# Patient Record
Sex: Female | Born: 1960 | Race: White | Hispanic: No | Marital: Married | Smoking: Never smoker
Health system: Southern US, Community
[De-identification: ages and names within clinical notes are randomized; demographics above are authoritative.]

## PROBLEM LIST (undated history)

## (undated) DIAGNOSIS — N2581 Secondary hyperparathyroidism of renal origin: Secondary | ICD-10-CM

## (undated) DIAGNOSIS — I517 Cardiomegaly: Secondary | ICD-10-CM

## (undated) DIAGNOSIS — M1990 Unspecified osteoarthritis, unspecified site: Secondary | ICD-10-CM

## (undated) DIAGNOSIS — I499 Cardiac arrhythmia, unspecified: Secondary | ICD-10-CM

## (undated) DIAGNOSIS — T8859XA Other complications of anesthesia, initial encounter: Secondary | ICD-10-CM

## (undated) DIAGNOSIS — N184 Chronic kidney disease, stage 4 (severe): Secondary | ICD-10-CM

## (undated) DIAGNOSIS — I129 Hypertensive chronic kidney disease with stage 1 through stage 4 chronic kidney disease, or unspecified chronic kidney disease: Secondary | ICD-10-CM

## (undated) DIAGNOSIS — R110 Nausea: Secondary | ICD-10-CM

## (undated) DIAGNOSIS — R0600 Dyspnea, unspecified: Secondary | ICD-10-CM

## (undated) DIAGNOSIS — Q613 Polycystic kidney, unspecified: Secondary | ICD-10-CM

## (undated) DIAGNOSIS — K219 Gastro-esophageal reflux disease without esophagitis: Secondary | ICD-10-CM

## (undated) DIAGNOSIS — Q6102 Congenital multiple renal cysts: Secondary | ICD-10-CM

## (undated) DIAGNOSIS — J189 Pneumonia, unspecified organism: Secondary | ICD-10-CM

## (undated) DIAGNOSIS — E785 Hyperlipidemia, unspecified: Secondary | ICD-10-CM

## (undated) DIAGNOSIS — N1832 Chronic kidney disease, stage 3b: Secondary | ICD-10-CM

## (undated) DIAGNOSIS — D631 Anemia in chronic kidney disease: Secondary | ICD-10-CM

## (undated) DIAGNOSIS — D329 Benign neoplasm of meninges, unspecified: Secondary | ICD-10-CM

## (undated) DIAGNOSIS — I10 Essential (primary) hypertension: Secondary | ICD-10-CM

## (undated) HISTORY — DX: Hyperlipidemia, unspecified: E78.5

## (undated) HISTORY — PX: APPENDECTOMY: SHX54

## (undated) HISTORY — DX: Chronic kidney disease, stage 3b: N18.32

## (undated) HISTORY — DX: Chronic kidney disease, stage 4 (severe): N18.4

## (undated) HISTORY — DX: Cardiomegaly: I51.7

## (undated) HISTORY — PX: WISDOM TOOTH EXTRACTION: SHX21

## (undated) HISTORY — DX: Dyspnea, unspecified: R06.00

## (undated) HISTORY — DX: Polycystic kidney, unspecified: Q61.3

## (undated) HISTORY — DX: Secondary hyperparathyroidism of renal origin: N25.81

## (undated) HISTORY — PX: CHOLECYSTECTOMY: SHX55

## (undated) HISTORY — DX: Hypertensive chronic kidney disease with stage 1 through stage 4 chronic kidney disease, or unspecified chronic kidney disease: I12.9

## (undated) HISTORY — DX: Congenital multiple renal cysts: Q61.02

## (undated) HISTORY — DX: Benign neoplasm of meninges, unspecified: D32.9

## (undated) HISTORY — PX: ABDOMINAL HYSTERECTOMY: SHX81

## (undated) HISTORY — DX: Anemia in chronic kidney disease: D63.1

## (undated) HISTORY — DX: Nausea: R11.0

---

## 1998-03-15 ENCOUNTER — Ambulatory Visit: Admission: RE | Admit: 1998-03-15 | Discharge: 1998-03-15 | Payer: Self-pay

## 2005-12-04 ENCOUNTER — Ambulatory Visit (HOSPITAL_COMMUNITY): Admission: RE | Admit: 2005-12-04 | Discharge: 2005-12-04 | Payer: Self-pay | Admitting: Nephrology

## 2005-12-04 ENCOUNTER — Encounter (INDEPENDENT_AMBULATORY_CARE_PROVIDER_SITE_OTHER): Payer: Self-pay | Admitting: Specialist

## 2006-02-24 ENCOUNTER — Encounter: Admission: RE | Admit: 2006-02-24 | Discharge: 2006-02-24 | Payer: Self-pay | Admitting: Nephrology

## 2007-04-21 ENCOUNTER — Ambulatory Visit (HOSPITAL_COMMUNITY): Admission: RE | Admit: 2007-04-21 | Discharge: 2007-04-21 | Payer: Self-pay | Admitting: Nephrology

## 2007-04-21 ENCOUNTER — Encounter (INDEPENDENT_AMBULATORY_CARE_PROVIDER_SITE_OTHER): Payer: Self-pay | Admitting: Diagnostic Radiology

## 2008-05-31 ENCOUNTER — Ambulatory Visit (HOSPITAL_COMMUNITY): Admission: RE | Admit: 2008-05-31 | Discharge: 2008-05-31 | Payer: Self-pay | Admitting: Nephrology

## 2008-05-31 ENCOUNTER — Encounter (INDEPENDENT_AMBULATORY_CARE_PROVIDER_SITE_OTHER): Payer: Self-pay | Admitting: Interventional Radiology

## 2008-11-11 ENCOUNTER — Encounter: Admission: RE | Admit: 2008-11-11 | Discharge: 2008-11-11 | Payer: Self-pay | Admitting: Nephrology

## 2008-11-11 ENCOUNTER — Ambulatory Visit (HOSPITAL_COMMUNITY): Admission: RE | Admit: 2008-11-11 | Discharge: 2008-11-11 | Payer: Self-pay | Admitting: Nephrology

## 2008-12-01 ENCOUNTER — Ambulatory Visit (HOSPITAL_COMMUNITY): Admission: RE | Admit: 2008-12-01 | Discharge: 2008-12-01 | Payer: Self-pay | Admitting: Nephrology

## 2009-05-30 ENCOUNTER — Ambulatory Visit (HOSPITAL_COMMUNITY): Admission: RE | Admit: 2009-05-30 | Discharge: 2009-05-30 | Payer: Self-pay | Admitting: Neurosurgery

## 2010-02-11 ENCOUNTER — Encounter: Payer: Self-pay | Admitting: Nephrology

## 2010-02-12 ENCOUNTER — Encounter: Payer: Self-pay | Admitting: Nephrology

## 2010-04-10 LAB — CREATININE, SERUM
Creatinine, Ser: 1.01 mg/dL (ref 0.4–1.2)
GFR calc Af Amer: >60 mL/min (ref >60)
GFR calc non Af Amer: 59 mL/min — ABNORMAL LOW (ref >60)

## 2010-04-10 LAB — BUN: BUN: 14 mg/dL (ref 6–23)

## 2010-05-01 LAB — CBC
HCT: 38.5 % (ref 36.0–46.0)
Hemoglobin: 13.7 g/dL (ref 12.0–15.0)
MCHC: 35.6 g/dL (ref 30.0–36.0)
MCV: 91.7 fL (ref 78.0–100.0)
Platelets: 125 10*3/uL — ABNORMAL LOW (ref 150–400)
RBC: 4.2 MIL/uL (ref 3.87–5.11)
RDW: 12.9 % (ref 11.5–15.5)
WBC: 5.7 10*3/uL (ref 4.0–10.5)

## 2010-05-01 LAB — BASIC METABOLIC PANEL
BUN: 16 mg/dL (ref 6–23)
CO2: 25 mEq/L (ref 19–32)
Calcium: 9.3 mg/dL (ref 8.4–10.5)
Chloride: 107 mEq/L (ref 96–112)
Creatinine, Ser: 1 mg/dL (ref 0.4–1.2)
GFR calc Af Amer: >60 mL/min (ref >60)
GFR calc non Af Amer: 60 mL/min — ABNORMAL LOW (ref >60)
Glucose, Bld: 104 mg/dL — ABNORMAL HIGH (ref 70–99)
Potassium: 4 mEq/L (ref 3.5–5.1)
Sodium: 138 mEq/L (ref 135–145)

## 2010-05-01 LAB — APTT: aPTT: 25 seconds (ref 24–37)

## 2010-05-01 LAB — PROTIME-INR
INR: 1.1 (ref 0.00–1.49)
Prothrombin Time: 14.1 seconds (ref 11.6–15.2)

## 2010-06-13 ENCOUNTER — Other Ambulatory Visit (HOSPITAL_COMMUNITY): Payer: Self-pay | Admitting: Neurosurgery

## 2010-06-13 DIAGNOSIS — R51 Headache: Secondary | ICD-10-CM

## 2010-06-13 DIAGNOSIS — D320 Benign neoplasm of cerebral meninges: Secondary | ICD-10-CM

## 2010-07-04 ENCOUNTER — Ambulatory Visit (HOSPITAL_COMMUNITY)
Admission: RE | Admit: 2010-07-04 | Discharge: 2010-07-04 | Disposition: A | Discharge status: Home / Self Care | Payer: Self-pay | Source: Ambulatory Visit | Attending: Neurosurgery | Admitting: Neurosurgery

## 2010-07-04 DIAGNOSIS — R51 Headache: Secondary | ICD-10-CM | POA: Insufficient documentation

## 2010-07-04 DIAGNOSIS — D320 Benign neoplasm of cerebral meninges: Secondary | ICD-10-CM | POA: Insufficient documentation

## 2010-07-04 LAB — CREATININE, SERUM
Creatinine, Ser: 1.1 mg/dL (ref 0.4–1.2)
GFR calc Af Amer: >60 mL/min (ref >60)
GFR calc non Af Amer: 53 mL/min — ABNORMAL LOW (ref >60)

## 2010-07-04 MED ORDER — GADOBENATE DIMEGLUMINE 529 MG/ML IV SOLN
17.0000 mL | Freq: Once | INTRAVENOUS | Status: AC | PRN
  Administered 2010-07-04: 17 mL via INTRAVENOUS

## 2010-10-15 LAB — CBC
HCT: 42.5
Hemoglobin: 15
MCHC: 35.3
MCV: 91.7
Platelets: 163
RBC: 4.63
RDW: 13.3
WBC: 7.1

## 2010-10-15 LAB — PROTIME-INR
INR: 1
Prothrombin Time: 13.2

## 2010-10-15 LAB — APTT: aPTT: 25

## 2011-02-14 ENCOUNTER — Encounter (HOSPITAL_COMMUNITY): Payer: Self-pay | Admitting: Pharmacy Technician

## 2011-02-14 ENCOUNTER — Other Ambulatory Visit: Payer: Self-pay | Admitting: Obstetrics & Gynecology

## 2011-02-14 ENCOUNTER — Other Ambulatory Visit (HOSPITAL_COMMUNITY): Payer: Self-pay | Admitting: Nephrology

## 2011-02-14 DIAGNOSIS — N281 Cyst of kidney, acquired: Secondary | ICD-10-CM

## 2011-02-15 ENCOUNTER — Other Ambulatory Visit: Payer: Self-pay | Admitting: Radiology

## 2011-02-19 ENCOUNTER — Other Ambulatory Visit (HOSPITAL_COMMUNITY): Payer: Self-pay

## 2011-02-20 ENCOUNTER — Ambulatory Visit (HOSPITAL_COMMUNITY)
Admission: RE | Admit: 2011-02-20 | Discharge: 2011-02-20 | Disposition: A | Discharge status: Home / Self Care | Payer: BC Managed Care – PPO | Source: Ambulatory Visit | Attending: Radiology | Admitting: Radiology

## 2011-02-20 ENCOUNTER — Ambulatory Visit (HOSPITAL_COMMUNITY)
Admission: RE | Admit: 2011-02-20 | Discharge: 2011-02-20 | Disposition: A | Discharge status: Home / Self Care | Payer: BC Managed Care – PPO | Source: Ambulatory Visit | Attending: Nephrology | Admitting: Nephrology

## 2011-02-20 VITALS — BP 124/65 | HR 44 | Temp 97.6°F | Resp 18 | Ht 64.0 in | Wt 162.0 lb

## 2011-02-20 DIAGNOSIS — N281 Cyst of kidney, acquired: Secondary | ICD-10-CM

## 2011-02-20 DIAGNOSIS — Q618 Other cystic kidney diseases: Secondary | ICD-10-CM | POA: Insufficient documentation

## 2011-02-20 LAB — CBC
HCT: 43.1 % (ref 36.0–46.0)
Hemoglobin: 15 g/dL (ref 12.0–15.0)
MCH: 31.6 pg (ref 26.0–34.0)
MCHC: 34.8 g/dL (ref 30.0–36.0)
MCV: 90.9 fL (ref 78.0–100.0)
Platelets: 139 10*3/uL — ABNORMAL LOW (ref 150–400)
RBC: 4.74 MIL/uL (ref 3.87–5.11)
RDW: 12.9 % (ref 11.5–15.5)
WBC: 5 10*3/uL (ref 4.0–10.5)

## 2011-02-20 LAB — PROTIME-INR
INR: 0.96 (ref 0.00–1.49)
Prothrombin Time: 13 seconds (ref 11.6–15.2)

## 2011-02-20 LAB — APTT: aPTT: 25 seconds (ref 24–37)

## 2011-02-20 MED ORDER — FENTANYL CITRATE 0.05 MG/ML IJ SOLN
INTRAMUSCULAR | Status: AC
  Filled 2011-02-20: qty 6

## 2011-02-20 MED ORDER — SODIUM CHLORIDE 0.9 % IV SOLN
INTRAVENOUS | Status: DC
  Administered 2011-02-20: 09:00:00 via INTRAVENOUS

## 2011-02-20 MED ORDER — MIDAZOLAM HCL 2 MG/2ML IJ SOLN
INTRAMUSCULAR | Status: AC
  Filled 2011-02-20: qty 6

## 2011-02-20 MED ORDER — MIDAZOLAM HCL 5 MG/5ML IJ SOLN
INTRAMUSCULAR | Status: AC | PRN
  Administered 2011-02-20 (×2): 1 mg via INTRAVENOUS
  Administered 2011-02-20: 2 mg via INTRAVENOUS

## 2011-02-20 MED ORDER — FENTANYL CITRATE 0.05 MG/ML IJ SOLN
INTRAMUSCULAR | Status: AC | PRN
  Administered 2011-02-20: 25 ug via INTRAVENOUS
  Administered 2011-02-20: 50 ug via INTRAVENOUS
  Administered 2011-02-20: 25 ug via INTRAVENOUS
  Administered 2011-02-20 (×2): 50 ug via INTRAVENOUS

## 2011-02-20 NOTE — H&P (Signed)
Debbie Ward is an 51 y.o. female.   Chief Complaint: symptomatic bilateral renal cysts HPI: Patient with history of symptomatic  polycystic kidney disease presents today for elective US guided aspiration of bilateral renal cysts.  PMH: migraines, menigioma, PCKD Psh: partial hyst., appy.,cholecystectomy No family history on file. Social History: married, lives in [place], prior smoker, no alcohol, 4 children  Allergies: No Known Allergies  Medications Prior to Admission  Medication Sig Dispense Refill  . lisinopril-hydrochlorothiazide (PRINZIDE,ZESTORETIC) 10-12.5 MG per tablet Take 1 tablet by mouth daily.       Medications Prior to Admission  Medication Dose Route Frequency Provider Last Rate Last Dose  . 0.9 %  sodium chloride infusion   Intravenous Continuous Robet Leu, PA 20 mL/hr at 02/20/11 1610      Results for orders placed during the hospital encounter of 02/20/11 (from the past 48 hour(s))  APTT     Status: Normal   Collection Time   02/20/11  8:58 AM      Component Value Range Comment   aPTT 25  24 - 37 (seconds)   CBC     Status: Abnormal   Collection Time   02/20/11  8:58 AM      Component Value Range Comment   WBC 5.0  4.0 - 10.5 (K/uL)    RBC 4.74  3.87 - 5.11 (MIL/uL)    Hemoglobin 15.0  12.0 - 15.0 (g/dL)    HCT 96.0  45.4 - 09.8 (%)    MCV 90.9  78.0 - 100.0 (fL)    MCH 31.6  26.0 - 34.0 (pg)    MCHC 34.8  30.0 - 36.0 (g/dL)    RDW 11.9  14.7 - 82.9 (%)    Platelets 139 (*) 150 - 400 (K/uL)   PROTIME-INR     Status: Normal   Collection Time   02/20/11  8:58 AM      Component Value Range Comment   Prothrombin Time 13.0  11.6 - 15.2 (seconds)    INR 0.96  0.00 - 1.49     No results found.  Review of Systems  Constitutional: Negative for fever and chills.  Respiratory: Negative for cough and shortness of breath.   Cardiovascular: Negative for chest pain.  Gastrointestinal: Negative for nausea, vomiting and abdominal pain.  Genitourinary:  Positive for flank pain. Negative for dysuria and hematuria.  Musculoskeletal: Positive for back pain.  Neurological: Positive for headaches.  Endo/Heme/Allergies: Does not bruise/bleed easily.    Blood pressure 124/65, pulse 44, temperature 97.6 F (36.4 C), temperature source Oral, resp. rate 18, height 5\' 4"  (1.626 m), weight 162 lb (73.483 kg), SpO2 95.00%. Physical Exam  Constitutional: She is oriented to person, place, and time. She appears well-developed and well-nourished.  Cardiovascular: Regular rhythm.        bradycardic  Respiratory: Effort normal and breath sounds normal.  GI: Soft. Bowel sounds are normal. There is no tenderness.  Musculoskeletal: Normal range of motion. She exhibits no edema.  Neurological: She is alert and oriented to person, place, and time.     Assessment/Plan: Patient with symptomatic polycystic kidney disease; plan is for follow up renal US followed by  aspiration of bilateral renal cysts. Jennalynn Rivard,D KEVIN 02/20/2011, 9:53 AM

## 2011-02-20 NOTE — ED Notes (Signed)
IV - 22 g to left hand.  Dressing clean dry and intact, flushes with ease and without complaint of discomfort.  Start NS at [facility] for procedure.

## 2011-02-21 ENCOUNTER — Telehealth (HOSPITAL_COMMUNITY): Payer: Self-pay

## 2011-12-30 ENCOUNTER — Other Ambulatory Visit (HOSPITAL_COMMUNITY): Payer: Self-pay | Admitting: Neurosurgery

## 2011-12-30 DIAGNOSIS — D320 Benign neoplasm of cerebral meninges: Secondary | ICD-10-CM

## 2012-01-06 ENCOUNTER — Ambulatory Visit (HOSPITAL_COMMUNITY): Payer: BC Managed Care – PPO

## 2012-01-08 ENCOUNTER — Ambulatory Visit (HOSPITAL_COMMUNITY): Admission: RE | Admit: 2012-01-08 | Payer: BC Managed Care – PPO | Source: Ambulatory Visit

## 2012-01-08 ENCOUNTER — Ambulatory Visit (HOSPITAL_COMMUNITY)
Admission: RE | Admit: 2012-01-08 | Discharge: 2012-01-08 | Disposition: A | Discharge status: Home / Self Care | Payer: BC Managed Care – PPO | Source: Ambulatory Visit | Attending: Neurosurgery | Admitting: Neurosurgery

## 2012-01-08 DIAGNOSIS — Z09 Encounter for follow-up examination after completed treatment for conditions other than malignant neoplasm: Secondary | ICD-10-CM | POA: Insufficient documentation

## 2012-01-08 DIAGNOSIS — D320 Benign neoplasm of cerebral meninges: Secondary | ICD-10-CM | POA: Insufficient documentation

## 2012-01-08 LAB — CREATININE, SERUM
Creatinine, Ser: 1.19 mg/dL — ABNORMAL HIGH (ref 0.50–1.10)
GFR calc Af Amer: 61 mL/min — ABNORMAL LOW (ref >90)
GFR calc non Af Amer: 53 mL/min — ABNORMAL LOW (ref >90)

## 2012-01-08 MED ORDER — GADOBENATE DIMEGLUMINE 529 MG/ML IV SOLN
17.0000 mL | Freq: Once | INTRAVENOUS | Status: AC | PRN
  Administered 2012-01-08: 17 mL via INTRAVENOUS

## 2012-05-12 ENCOUNTER — Ambulatory Visit: Payer: Self-pay | Admitting: Neurology

## 2012-07-28 ENCOUNTER — Other Ambulatory Visit (HOSPITAL_COMMUNITY): Payer: Self-pay | Admitting: Nephrology

## 2012-07-28 DIAGNOSIS — M549 Dorsalgia, unspecified: Secondary | ICD-10-CM

## 2012-07-28 DIAGNOSIS — N281 Cyst of kidney, acquired: Secondary | ICD-10-CM

## 2012-07-28 DIAGNOSIS — R809 Proteinuria, unspecified: Secondary | ICD-10-CM

## 2012-07-30 ENCOUNTER — Other Ambulatory Visit: Payer: Self-pay | Admitting: Radiology

## 2012-07-30 ENCOUNTER — Encounter (HOSPITAL_COMMUNITY): Payer: Self-pay | Admitting: Pharmacy Technician

## 2012-08-03 ENCOUNTER — Ambulatory Visit (HOSPITAL_COMMUNITY)
Admission: RE | Admit: 2012-08-03 | Discharge: 2012-08-03 | Disposition: A | Discharge status: Home / Self Care | Payer: Self-pay | Source: Ambulatory Visit | Attending: Nephrology | Admitting: Nephrology

## 2012-08-03 ENCOUNTER — Other Ambulatory Visit (HOSPITAL_COMMUNITY): Payer: Self-pay | Admitting: Nephrology

## 2012-08-03 ENCOUNTER — Encounter (HOSPITAL_COMMUNITY): Payer: Self-pay

## 2012-08-03 VITALS — BP 112/71 | HR 56 | Temp 97.6°F | Resp 14 | Ht 64.0 in | Wt 172.0 lb

## 2012-08-03 DIAGNOSIS — Q612 Polycystic kidney, adult type: Secondary | ICD-10-CM | POA: Insufficient documentation

## 2012-08-03 DIAGNOSIS — M549 Dorsalgia, unspecified: Secondary | ICD-10-CM

## 2012-08-03 DIAGNOSIS — R809 Proteinuria, unspecified: Secondary | ICD-10-CM

## 2012-08-03 DIAGNOSIS — Q613 Polycystic kidney, unspecified: Secondary | ICD-10-CM | POA: Insufficient documentation

## 2012-08-03 DIAGNOSIS — N281 Cyst of kidney, acquired: Secondary | ICD-10-CM

## 2012-08-03 HISTORY — DX: Polycystic kidney, unspecified: Q61.3

## 2012-08-03 LAB — CBC
HCT: 41.6 % (ref 36.0–46.0)
Hemoglobin: 14.7 g/dL (ref 12.0–15.0)
MCH: 31.4 pg (ref 26.0–34.0)
MCHC: 35.3 g/dL (ref 30.0–36.0)
MCV: 88.9 fL (ref 78.0–100.0)
Platelets: 144 10*3/uL — ABNORMAL LOW (ref 150–400)
RBC: 4.68 MIL/uL (ref 3.87–5.11)
RDW: 13.4 % (ref 11.5–15.5)
WBC: 5.5 10*3/uL (ref 4.0–10.5)

## 2012-08-03 LAB — PROTIME-INR
INR: 0.99 (ref 0.00–1.49)
Prothrombin Time: 12.9 seconds (ref 11.6–15.2)

## 2012-08-03 LAB — APTT: aPTT: 23 seconds — ABNORMAL LOW (ref 24–37)

## 2012-08-03 MED ORDER — HYDROCODONE-ACETAMINOPHEN 5-325 MG PO TABS
ORAL_TABLET | ORAL | Status: AC
  Filled 2012-08-03: qty 2

## 2012-08-03 MED ORDER — SODIUM CHLORIDE 0.9 % IV SOLN
Freq: Once | INTRAVENOUS | Status: AC
  Administered 2012-08-03: 14:00:00 via INTRAVENOUS

## 2012-08-03 MED ORDER — FENTANYL CITRATE 0.05 MG/ML IJ SOLN
INTRAMUSCULAR | Status: AC
  Filled 2012-08-03: qty 4

## 2012-08-03 MED ORDER — FENTANYL CITRATE 0.05 MG/ML IJ SOLN
INTRAMUSCULAR | Status: AC | PRN
  Administered 2012-08-03 (×2): 12.5 ug via INTRAVENOUS
  Administered 2012-08-03 (×7): 25 ug via INTRAVENOUS

## 2012-08-03 MED ORDER — MIDAZOLAM HCL 2 MG/2ML IJ SOLN
INTRAMUSCULAR | Status: AC | PRN
  Administered 2012-08-03: 0.5 mg via INTRAVENOUS
  Administered 2012-08-03 (×2): 1 mg via INTRAVENOUS
  Administered 2012-08-03 (×3): 0.5 mg via INTRAVENOUS

## 2012-08-03 MED ORDER — HYDROCODONE-ACETAMINOPHEN 5-325 MG PO TABS
1.0000 | ORAL_TABLET | ORAL | Status: DC | PRN
  Administered 2012-08-03: 2 via ORAL

## 2012-08-03 MED ORDER — MIDAZOLAM HCL 2 MG/2ML IJ SOLN
INTRAMUSCULAR | Status: AC
  Filled 2012-08-03: qty 4

## 2012-08-03 NOTE — Procedures (Signed)
Interventional Radiology Procedure Note  Procedure: US guided aspiration of multiple bilateral renal cysts Complications: None Recommendations: - Bedrest x 2 hrs - Return to IR if/when symptoms return  Signed,  Sterling Big, MD Vascular & Interventional Radiologist [facility]

## 2012-08-03 NOTE — H&P (Signed)
Agree with PA note.    Signed,  Karmello Abercrombie K. Sokha Craker, MD Vascular & Interventional Radiologist [facility]  

## 2012-08-03 NOTE — H&P (Signed)
Chief Complaint: "I have polycystic kidney disease and here for aspiration today." Referring Physician: Dr. Hyman Hopes HPI: Debbie Ward is an 52 y.o. female with PMHx PCKD. She states she has multiple renal cyst aspirations. Last renal cyst aspiration was 01/2011. Patient c/o B/L flank pain left > right. She denies any hematuria, denies any recent illness, fever or chills.   Past Medical History:  Past Medical History  Diagnosis Date  . Polycystic renal disease     Past Surgical History:  Past Surgical History  Procedure Laterality Date  . Appendectomy    . Abdominal hysterectomy      partial  . Cholecystectomy      Family History: History reviewed. No pertinent family history.  Social History:  reports that she has never smoked. She does not have any smokeless tobacco history on file. Her alcohol and drug histories are not on file.  Allergies: No Known Allergies    Medication List    ASK your doctor about these medications       ibuprofen 200 MG tablet  Commonly known as:  ADVIL,MOTRIN  Take 400 mg by mouth daily as needed for pain.     lisinopril-hydrochlorothiazide 10-12.5 MG per tablet  Commonly known as:  PRINZIDE,ZESTORETIC  Take 1 tablet by mouth daily.       Please HPI for pertinent positives, otherwise complete 10 system ROS negative.  Physical Exam: BP 123/62  Pulse 52  Temp(Src) 98.6 F (37 C) (Oral)  Resp 18  Ht 5\' 4"  (1.626 m)  Wt 172 lb (78.019 kg)  BMI 29.51 kg/m2  SpO2 99% Body mass index is 29.51 kg/(m^2).   General Appearance:  Alert, cooperative, no distress, appears stated age  Head:  Normocephalic, without obvious abnormality, atraumatic  ENT: Unremarkable  Neck: Supple, symmetrical, trachea midline  Lungs:   Clear to auscultation bilaterally, no w/r/r, respirations unlabored without use of accessory muscles.  Chest Wall:  No tenderness or deformity  Heart:  Regular rate and rhythm, S1, S2 normal, no murmur, rub or gallop.  Abdomen:    Soft, non-tender, non distended.  Extremities: Extremities normal, atraumatic, no cyanosis or edema  Neurologic: Normal affect, no gross deficits.   No results found for this or any previous visit (from the past 48 hour(s)). No results found.  Assessment/Plan Symptomatic polycystic kidney disease with B/L flank pain. History of B/L renal cyst aspirations with relief of pain. Patient c/o B/L flank pain left > right. Patient is scheduled for ultrasound guided renal cyst aspiration. Risks and Benefits discussed with the patient. All of the patient's questions were answered, patient is agreeable to proceed. Consent signed and in chart.   Pattricia Boss D PA-C 08/03/2012, 1:19 PM

## 2013-09-29 ENCOUNTER — Other Ambulatory Visit (HOSPITAL_COMMUNITY): Payer: Self-pay | Admitting: Nephrology

## 2013-09-29 DIAGNOSIS — Q6102 Congenital multiple renal cysts: Secondary | ICD-10-CM

## 2013-10-07 ENCOUNTER — Other Ambulatory Visit: Payer: Self-pay | Admitting: Radiology

## 2013-10-08 ENCOUNTER — Encounter (HOSPITAL_COMMUNITY): Payer: Self-pay | Admitting: Pharmacy Technician

## 2013-10-11 ENCOUNTER — Ambulatory Visit (HOSPITAL_COMMUNITY)
Admission: RE | Admit: 2013-10-11 | Discharge: 2013-10-11 | Disposition: A | Discharge status: Home / Self Care | Payer: BC Managed Care – PPO | Source: Ambulatory Visit | Attending: Nephrology | Admitting: Nephrology

## 2013-10-11 ENCOUNTER — Other Ambulatory Visit (HOSPITAL_COMMUNITY): Payer: Self-pay | Admitting: Nephrology

## 2013-10-11 DIAGNOSIS — Q618 Other cystic kidney diseases: Secondary | ICD-10-CM | POA: Diagnosis not present

## 2013-10-11 DIAGNOSIS — Q6102 Congenital multiple renal cysts: Secondary | ICD-10-CM

## 2013-10-11 LAB — CBC
HCT: 42 % (ref 36.0–46.0)
Hemoglobin: 15 g/dL (ref 12.0–15.0)
MCH: 31.6 pg (ref 26.0–34.0)
MCHC: 35.7 g/dL (ref 30.0–36.0)
MCV: 88.4 fL (ref 78.0–100.0)
Platelets: 142 10*3/uL — ABNORMAL LOW (ref 150–400)
RBC: 4.75 MIL/uL (ref 3.87–5.11)
RDW: 12.7 % (ref 11.5–15.5)
WBC: 5 10*3/uL (ref 4.0–10.5)

## 2013-10-11 LAB — PROTIME-INR
INR: 0.99 (ref 0.00–1.49)
Prothrombin Time: 13.1 seconds (ref 11.6–15.2)

## 2013-10-11 LAB — APTT: aPTT: 24 seconds (ref 24–37)

## 2013-10-11 MED ORDER — SODIUM CHLORIDE 0.9 % IV SOLN
INTRAVENOUS | Status: DC
  Administered 2013-10-11: 09:00:00 via INTRAVENOUS

## 2013-10-11 MED ORDER — FENTANYL CITRATE 0.05 MG/ML IJ SOLN
INTRAMUSCULAR | Status: AC | PRN
  Administered 2013-10-11: 50 ug via INTRAVENOUS

## 2013-10-11 MED ORDER — MIDAZOLAM HCL 2 MG/2ML IJ SOLN
INTRAMUSCULAR | Status: AC | PRN
  Administered 2013-10-11: 0.5 mg via INTRAVENOUS

## 2013-10-11 MED ORDER — MIDAZOLAM HCL 2 MG/2ML IJ SOLN
INTRAMUSCULAR | Status: AC | PRN
  Administered 2013-10-11: 1 mg via INTRAVENOUS

## 2013-10-11 MED ORDER — LIDOCAINE HCL (PF) 1 % IJ SOLN
INTRAMUSCULAR | Status: AC
  Filled 2013-10-11: qty 20

## 2013-10-11 MED ORDER — HYDROCODONE-ACETAMINOPHEN 5-325 MG PO TABS
ORAL_TABLET | ORAL | Status: AC
  Filled 2013-10-11: qty 1

## 2013-10-11 MED ORDER — MIDAZOLAM HCL 2 MG/2ML IJ SOLN
INTRAMUSCULAR | Status: AC
  Filled 2013-10-11: qty 4

## 2013-10-11 MED ORDER — MIDAZOLAM HCL 2 MG/2ML IJ SOLN
INTRAMUSCULAR | Status: AC | PRN
  Administered 2013-10-11: 2 mg via INTRAVENOUS

## 2013-10-11 MED ORDER — HYDROCODONE-ACETAMINOPHEN 5-325 MG PO TABS
1.0000 | ORAL_TABLET | Freq: Once | ORAL | Status: AC
  Administered 2013-10-11: 1 via ORAL
  Filled 2013-10-11: qty 2

## 2013-10-11 MED ORDER — FENTANYL CITRATE 0.05 MG/ML IJ SOLN
INTRAMUSCULAR | Status: AC
  Filled 2013-10-11: qty 4

## 2013-10-11 NOTE — H&P (Signed)
Chief Complaint: Polycystic kidney disease New onset back pain x few weeks  Referring Physician(s): Webb,Martin W  History of Present Illness: Debbie Ward is a 53 y.o. female  Pt with Polycystic kidney disease Has had previous aspirations of cysts in 07/2012 and 01/2011 Good relief Has had new onset back pain with most movements x few weeks Scheduled now for aspiration of Bilateral renal cysts   Past Medical History  Diagnosis Date  . Polycystic renal disease     Past Surgical History  Procedure Laterality Date  . Appendectomy    . Abdominal hysterectomy      partial  . Cholecystectomy      Allergies: Percocet  Medications: Prior to Admission medications   Medication Sig Start Date End Date Taking? Authorizing Provider  acetaminophen (TYLENOL) 650 MG CR tablet Take 1,300 mg by mouth daily as needed for pain.   Yes Historical Provider, MD  ibuprofen (ADVIL,MOTRIN) 200 MG tablet Take 400 mg by mouth daily as needed for pain.    Yes Historical Provider, MD  lisinopril-hydrochlorothiazide (PRINZIDE,ZESTORETIC) 10-12.5 MG per tablet Take 1 tablet by mouth daily.   Yes Historical Provider, MD  phentermine (ADIPEX-P) 37.5 MG tablet Take 37.5 mg by mouth daily before breakfast.   Yes Historical Provider, MD    No family history on file.  History   Social History  . Marital Status: Married    Spouse Name: N/A    Number of Children: N/A  . Years of Education: N/A   Social History Main Topics  . Smoking status: Never Smoker   . Smokeless tobacco: Not on file  . Alcohol Use: Not on file  . Drug Use: Not on file  . Sexual Activity: Not on file   Other Topics Concern  . Not on file   Social History Narrative  . No narrative on file     Review of Systems: A 12 point ROS discussed and pertinent positives are indicated in the HPI above.  All other systems are negative.  Review of Systems  Constitutional: Positive for activity change. Negative for appetite  change and unexpected weight change.  Respiratory: Negative for chest tightness and shortness of breath.   Cardiovascular: Negative for chest pain.  Gastrointestinal: Positive for abdominal pain.  Genitourinary: Negative for dysuria and frequency.  Musculoskeletal: Positive for back pain.  Psychiatric/Behavioral: Negative for confusion.    Vital Signs: BP 131/83  Pulse 58  Temp(Src) 98 F (36.7 C) (Oral)  Resp 18  Ht 5\' 4"  (1.626 m)  Wt 83.915 kg (185 lb)  BMI 31.74 kg/m2  SpO2 98%  Physical Exam  Constitutional: She is oriented to person, place, and time. She appears well-nourished.  Cardiovascular: Normal rate and regular rhythm.   No murmur heard. Pulmonary/Chest: Effort normal and breath sounds normal. She has no wheezes.  Abdominal: Soft. Bowel sounds are normal. There is no tenderness.  Musculoskeletal: Normal range of motion.  Neurological: She is alert and oriented to person, place, and time.  Skin: Skin is warm and dry.  Psychiatric: She has a normal mood and affect. Her behavior is normal. Judgment and thought content normal.    Imaging: No results found.  Labs: Lab Results  Component Value Date   WBC 5.0 10/11/2013   HCT 42.0 10/11/2013   MCV 88.4 10/11/2013   PLT 142* 10/11/2013   NA 138 05/31/2008   K 4.0 05/31/2008   CL 107 05/31/2008   CO2 25 05/31/2008   GLUCOSE 104* 05/31/2008  BUN 14 05/30/2009   CREATININE 1.19* 01/08/2012   CALCIUM 9.3 05/31/2008   GFRNONAA 53* 01/08/2012   GFRAA 61* 01/08/2012   INR 0.99 08/03/2012    Assessment and Plan:  Known PCK disease Has had renal cyst aspirations previously with great relief New back pain for few weeks Now scheduled for [place] and probable aspiration B cysts Pt aware of procedure benefits and risks and agreeable to proceed Consent signed and  In chart  Thank you for this interesting consult.  I greatly enjoyed meeting Debbie Ward and look forward to participating in their care.  Lavonia Drafts  PAC-IR   I spent a total of 20 minutes face to face in clinical consultation, greater than 50% of which was counseling/coordinating care for B renal cysts aspiration

## 2013-10-11 NOTE — Discharge Instructions (Signed)
Fine Needle Aspiration °Fine needle aspiration is a procedure used to remove a piece of tissue. The tissue may be removed from a swelling or abnormal growth (tumor). It is also used to confirm a cyst. A cyst is a fluid filled sac. The procedure may be done by your caregiver or a specialist. °LET YOUR CAREGIVER KNOW ABOUT:  °· Any medications you take, especially blood thinners like aspirin. °· Any problems you may have had with similar procedures in the past. °RISKS AND COMPLICATIONS °This is a safe procedure. There is a very small risk of infection and or bleeding. Other complications can occur if the aspiration site is deep in the body. Your caregiver or specialist will explain this to you.  °BEFORE THE PROCEDURE  °· No special preparation is needed in most cases. Your caregiver will let you know if there are special requirements, such as an empty stomach. °· Be sure to ask your caregiver any questions before the procedure starts. °PROCEDURE  °· This procedure is done under local or no anesthetic. °· The skin is cleaned carefully. °· A thin needle is directed into a lump. The needle is directed in several different directions into the lump while suction is applied to the needle. When samples are removed, the needle is withdrawn. °· The contents obtained are placed on a slide. They are then fixed, stained and examined under the microscope. The slide is examined by a specialist in the examination of tissue (pathologist). °· A diagnosis can then be made. The pathologist will decide if the specimen is cancerous (malignant) or not cancerous (benign). If fluid is taken from a cyst, cells from the fluid can be examined. If no material is obtained from a fine needle aspiration, the sample may not rule out a problem. Sometimes the procedure is done again. The pathologist may need several days before a result is available. °AFTER THE PROCEDURE  °· There are usually no limits on diet or activity after a fine needle  aspiration. °· Your caregiver may give you instructions regarding the aspiration site. This will include information about keeping the site clean and dry. Follow these instructions carefully. °· Call for your test results as instructed by your caregiver. Remember, it is your responsibility to get the results of your testing. Do not think everything is fine if you have not heard from your caregiver. °· Keep a close watch on the aspiration site. Report any redness, swelling or drainage. °· You should not have much pain at the aspiration site. °· Take any medications as told by your caregiver. °SEEK MEDICAL CARE IF:  °· You have pain or drainage at the aspiration site that does not go away. °· Swelling at the aspiration site does not gradually go away. °SEEK IMMEDIATE MEDICAL CARE IF:  °· You develop a fever a day or two after the aspiration. °· You develop severe pain at the aspiration site. °· You develop a warm, tender swelling at the aspiration site. °Document Released: 01/05/2000 Document Revised: 04/01/2011 Document Reviewed: 09/18/2007 °ExitCare® Patient Information ©2015 [employer]. This information is not intended to replace advice given to you by your health care provider. Make sure you discuss any questions you have with your health care provider. ° °

## 2013-10-11 NOTE — Procedures (Signed)
Interventional Radiology Procedure Note  Procedure: US guided aspiration of multiple bilateral renal cysts.  A total of 1000 mL cyst fluid was aspirated.  Complications: None Recommendations: - Bedrest x 3 hrs - ADAT  Signed,  Criselda Peaches, MD

## 2013-12-01 DIAGNOSIS — R404 Transient alteration of awareness: Secondary | ICD-10-CM

## 2013-12-01 DIAGNOSIS — F32A Depression, unspecified: Secondary | ICD-10-CM

## 2013-12-01 HISTORY — DX: Depression, unspecified: F32.A

## 2013-12-01 HISTORY — DX: Transient alteration of awareness: R40.4

## 2014-04-20 DIAGNOSIS — R569 Unspecified convulsions: Secondary | ICD-10-CM | POA: Insufficient documentation

## 2014-04-20 HISTORY — DX: Unspecified convulsions: R56.9

## 2014-10-14 ENCOUNTER — Other Ambulatory Visit (HOSPITAL_COMMUNITY): Payer: Self-pay | Admitting: Nephrology

## 2014-10-14 DIAGNOSIS — Q6102 Congenital multiple renal cysts: Secondary | ICD-10-CM

## 2014-10-19 ENCOUNTER — Other Ambulatory Visit: Payer: Self-pay | Admitting: Radiology

## 2014-10-20 ENCOUNTER — Ambulatory Visit (HOSPITAL_COMMUNITY)
Admission: RE | Admit: 2014-10-20 | Discharge: 2014-10-20 | Disposition: A | Discharge status: Home / Self Care | Payer: BLUE CROSS/BLUE SHIELD | Source: Ambulatory Visit | Attending: Physician Assistant | Admitting: Physician Assistant

## 2014-10-20 ENCOUNTER — Encounter (HOSPITAL_COMMUNITY): Payer: Self-pay

## 2014-10-20 ENCOUNTER — Ambulatory Visit (HOSPITAL_COMMUNITY)
Admission: RE | Admit: 2014-10-20 | Discharge: 2014-10-20 | Disposition: A | Discharge status: Home / Self Care | Payer: BLUE CROSS/BLUE SHIELD | Source: Ambulatory Visit | Attending: Nephrology | Admitting: Nephrology

## 2014-10-20 DIAGNOSIS — M545 Low back pain: Secondary | ICD-10-CM | POA: Diagnosis not present

## 2014-10-20 DIAGNOSIS — Q612 Polycystic kidney, adult type: Secondary | ICD-10-CM | POA: Diagnosis not present

## 2014-10-20 DIAGNOSIS — Q6102 Congenital multiple renal cysts: Secondary | ICD-10-CM | POA: Insufficient documentation

## 2014-10-20 DIAGNOSIS — R109 Unspecified abdominal pain: Secondary | ICD-10-CM | POA: Insufficient documentation

## 2014-10-20 DIAGNOSIS — R934 Abnormal findings on diagnostic imaging of urinary organs: Secondary | ICD-10-CM | POA: Diagnosis not present

## 2014-10-20 DIAGNOSIS — R319 Hematuria, unspecified: Secondary | ICD-10-CM

## 2014-10-20 DIAGNOSIS — Z79899 Other long term (current) drug therapy: Secondary | ICD-10-CM | POA: Insufficient documentation

## 2014-10-20 DIAGNOSIS — G8929 Other chronic pain: Secondary | ICD-10-CM | POA: Diagnosis not present

## 2014-10-20 DIAGNOSIS — Q613 Polycystic kidney, unspecified: Secondary | ICD-10-CM | POA: Diagnosis present

## 2014-10-20 DIAGNOSIS — Q446 Cystic disease of liver: Secondary | ICD-10-CM | POA: Diagnosis not present

## 2014-10-20 LAB — PROTIME-INR
INR: 0.98 (ref 0.00–1.49)
Prothrombin Time: 13.2 seconds (ref 11.6–15.2)

## 2014-10-20 LAB — CBC
HCT: 44.2 % (ref 36.0–46.0)
Hemoglobin: 15.2 g/dL — ABNORMAL HIGH (ref 12.0–15.0)
MCH: 30.9 pg (ref 26.0–34.0)
MCHC: 34.4 g/dL (ref 30.0–36.0)
MCV: 89.8 fL (ref 78.0–100.0)
Platelets: 153 10*3/uL (ref 150–400)
RBC: 4.92 MIL/uL (ref 3.87–5.11)
RDW: 12.7 % (ref 11.5–15.5)
WBC: 5.2 10*3/uL (ref 4.0–10.5)

## 2014-10-20 LAB — APTT: aPTT: 25 seconds (ref 24–37)

## 2014-10-20 MED ORDER — FENTANYL CITRATE (PF) 100 MCG/2ML IJ SOLN
INTRAMUSCULAR | Status: AC
  Filled 2014-10-20: qty 2

## 2014-10-20 MED ORDER — LIDOCAINE HCL (PF) 1 % IJ SOLN
INTRAMUSCULAR | Status: AC
  Filled 2014-10-20: qty 20

## 2014-10-20 MED ORDER — MIDAZOLAM HCL 2 MG/2ML IJ SOLN
INTRAMUSCULAR | Status: AC
  Filled 2014-10-20: qty 2

## 2014-10-20 MED ORDER — SODIUM CHLORIDE 0.9 % IV SOLN: Freq: Once | INTRAVENOUS | Status: DC

## 2014-10-20 MED ORDER — MIDAZOLAM HCL 2 MG/2ML IJ SOLN
INTRAMUSCULAR | Status: AC | PRN
  Administered 2014-10-20: 1 mg via INTRAVENOUS
  Administered 2014-10-20: 0.5 mg via INTRAVENOUS
  Administered 2014-10-20: 1 mg via INTRAVENOUS
  Administered 2014-10-20: 0.5 mg via INTRAVENOUS

## 2014-10-20 MED ORDER — HYDROCODONE-ACETAMINOPHEN 5-325 MG PO TABS
1.0000 | ORAL_TABLET | ORAL | Status: DC | PRN
  Administered 2014-10-20: 1 via ORAL

## 2014-10-20 MED ORDER — FENTANYL CITRATE (PF) 100 MCG/2ML IJ SOLN
INTRAMUSCULAR | Status: AC | PRN
  Administered 2014-10-20: 50 ug via INTRAVENOUS
  Administered 2014-10-20: 25 ug via INTRAVENOUS
  Administered 2014-10-20: 50 ug via INTRAVENOUS

## 2014-10-20 MED ORDER — ACETAMINOPHEN 500 MG PO TABS
ORAL_TABLET | ORAL | Status: AC
  Filled 2014-10-20: qty 1

## 2014-10-20 MED ORDER — HYDROCODONE-ACETAMINOPHEN 5-325 MG PO TABS
ORAL_TABLET | ORAL | Status: AC
  Administered 2014-10-20: 1 via ORAL
  Filled 2014-10-20: qty 1

## 2014-10-20 MED ORDER — ACETAMINOPHEN 500 MG PO TABS
500.0000 mg | ORAL_TABLET | Freq: Four times a day (QID) | ORAL | Status: DC | PRN
  Administered 2014-10-20: 500 mg via ORAL

## 2014-10-20 NOTE — Sedation Documentation (Signed)
Patient is resting comfortably. 

## 2014-10-20 NOTE — Progress Notes (Signed)
Pt c/o 7/10 flank pain; tylenol 500mg  given one hour previous without relief; pt also states urine bloody; Dr. Anselm Pancoast paged

## 2014-10-20 NOTE — Procedures (Signed)
US guided aspiration of bilateral renal cysts.  500 ml removed from left kidney.  400 ml removed from right kidney.  Minimal blood loss and no immediate complication.

## 2014-10-20 NOTE — H&P (Signed)
Chief Complaint: Patient was seen in consultation today for polycystic kidney disease at the request of Webb,Martin  Referring Physician(s): Webb,Martin  History of Present Illness: Debbie Ward is a 54 y.o. female   Polycystic kidney disease Continues to have chronic back pain secondary to cysts and build up of fluid Has had aspirations 2013; 2014; 2015---all relieving back pain nicely until recurrence of fluid Now has had increasing back pain x 2 months Scheduled for renal cyst aspiration   Past Medical History  Diagnosis Date  . Polycystic renal disease     Past Surgical History  Procedure Laterality Date  . Appendectomy    . Abdominal hysterectomy      partial  . Cholecystectomy      Allergies: Percocet  Medications: Prior to Admission medications   Medication Sig Start Date End Date Taking? Authorizing Provider  acetaminophen (TYLENOL) 650 MG CR tablet Take 1,300 mg by mouth daily as needed for pain.   Yes Historical Provider, MD  ibuprofen (ADVIL,MOTRIN) 200 MG tablet Take 400 mg by mouth daily as needed for pain.    Yes Historical Provider, MD  lisinopril-hydrochlorothiazide (PRINZIDE,ZESTORETIC) 10-12.5 MG per tablet Take 1 tablet by mouth daily.   Yes Historical Provider, MD  phentermine (ADIPEX-P) 37.5 MG tablet Take 37.5 mg by mouth daily before breakfast.    Historical Provider, MD     History reviewed. No pertinent family history.  Social History   Social History  . Marital Status: Married    Spouse Name: N/A  . Number of Children: N/A  . Years of Education: N/A   Social History Main Topics  . Smoking status: Never Smoker   . Smokeless tobacco: None  . Alcohol Use: None  . Drug Use: None  . Sexual Activity: Not Asked   Other Topics Concern  . None   Social History Narrative     Review of Systems: A 12 point ROS discussed and pertinent positives are indicated in the HPI above.  All other systems are negative.  Review of Systems    Constitutional: Positive for activity change. Negative for fever and appetite change.  Gastrointestinal: Positive for abdominal pain.  Musculoskeletal: Positive for back pain.  Neurological: Negative for weakness.  Psychiatric/Behavioral: Negative for behavioral problems and confusion.    Vital Signs: BP 114/73 mmHg  Pulse 47  Temp(Src) 97.7 F (36.5 C) (Oral)  Resp 18  Ht 5\' 4"  (1.626 m)  Wt 178 lb (80.74 kg)  BMI 30.54 kg/m2  SpO2 96%  Physical Exam  Constitutional: She is oriented to person, place, and time. She appears well-nourished.  Cardiovascular: Normal rate, regular rhythm and normal heart sounds.   Pulmonary/Chest: Effort normal and breath sounds normal. She has no wheezes.  Abdominal: Soft. Bowel sounds are normal. There is no tenderness.  Musculoskeletal: Normal range of motion.  Neurological: She is alert and oriented to person, place, and time.  Skin: Skin is warm and dry.  Psychiatric: She has a normal mood and affect. Her behavior is normal. Judgment and thought content normal.  Nursing note and vitals reviewed.   Mallampati Score:  MD Evaluation Airway: WNL Heart: WNL Abdomen: WNL Chest/ Lungs: WNL ASA  Classification: 2 Mallampati/Airway Score: One  Imaging: No results found.  Labs:  CBC:  Recent Labs  10/20/14 0904  WBC 5.2  HGB 15.2*  HCT 44.2  PLT 153    COAGS:  Recent Labs  10/20/14 0904  INR 0.98  APTT 25    BMP:  No results for input(s): NA, K, CL, CO2, GLUCOSE, BUN, CALCIUM, CREATININE, GFRNONAA, GFRAA in the last 8760 hours.  Invalid input(s): CMP  LIVER FUNCTION TESTS: No results for input(s): BILITOT, AST, ALT, ALKPHOS, PROT, ALBUMIN in the last 8760 hours.  TUMOR MARKERS: No results for input(s): AFPTM, CEA, CA199, CHROMGRNA in the last 8760 hours.  Assessment and Plan:  Polycystic kidney disease Previous cyst aspirations for chronic back -- result in good relief Worsening back pain x 2 mo Now scheduled  for cyst aspiration Risks and Benefits discussed with the patient including, but not limited to bleeding, infection, damage to adjacent structures. All of the patient's questions were answered, patient is agreeable to proceed. Consent signed and in chart.   Thank you for this interesting consult.  I greatly enjoyed meeting Debbie Ward and look forward to participating in their care.  A copy of this report was sent to the requesting provider on this date.  Signed: TURPIN,PAMELA A 10/20/2014, 9:30 AM   I spent a total of  30 Minutes   in face to face in clinical consultation, greater than 50% of which was counseling/coordinating care for PCK disease---cyst aspiration

## 2014-10-20 NOTE — Progress Notes (Signed)
Pt returned from CT scan with stable VS.  States her pain is much better now. See pain scale.  Husband at bedside. Waiting to her results of CT scan from Dr. Anselm Pancoast

## 2014-10-20 NOTE — Discharge Instructions (Signed)
Kidney Biopsy, Care After °Refer to this sheet in the next few weeks. These instructions provide you with information on caring for yourself after your procedure. Your health care provider may also give you more specific instructions. Your treatment has been planned according to current medical practices, but problems sometimes occur. Call your health care provider if you have any problems or questions after your procedure.  °WHAT TO EXPECT AFTER THE PROCEDURE  °· You may notice blood in the urine for the first 24 hours after the biopsy. °· You may feel some pain at the biopsy site for 1-2 weeks after the biopsy. °HOME CARE INSTRUCTIONS °· Do not lift anything heavier than 10 lb (4.5 kg) for 2 weeks. °· Do not take any non-steroidal anti-inflammatory drugs (NSAIDs) or any blood thinners for a week after the biopsy unless instructed to do so by your health care provider. °· Only take medicines for pain, fever, or discomfort as directed by your health care provider. °SEEK MEDICAL CARE IF: °· You have bloody urine more than 24 hours after the biopsy.   °· You develop a fever.   °· You cannot urinate.   °· You have increasing pain at the biopsy site.   °SEEK IMMEDIATE MEDICAL CARE IF: °You feel faint or dizzy.  °Document Released: 09/09/2012 Document Reviewed: 09/09/2012 °ExitCare® Patient Information ©2015 [employer]. This information is not intended to replace advice given to you by your health care provider. Make sure you discuss any questions you have with your health care provider. ° °

## 2014-10-20 NOTE — Progress Notes (Signed)
Dr. Anselm Pancoast and Lockie Mola up to see pt. Orders received.  Pt to go to CT

## 2014-10-20 NOTE — Sedation Documentation (Signed)
Bandaids to bilateral flank area applied. No bleeding present. Puncture wounds dry and intact.

## 2016-03-07 ENCOUNTER — Other Ambulatory Visit (HOSPITAL_COMMUNITY): Payer: Self-pay | Admitting: Nephrology

## 2016-03-07 DIAGNOSIS — Q613 Polycystic kidney, unspecified: Secondary | ICD-10-CM

## 2016-03-22 ENCOUNTER — Other Ambulatory Visit: Payer: Self-pay | Admitting: Radiology

## 2016-03-25 ENCOUNTER — Other Ambulatory Visit (HOSPITAL_COMMUNITY): Payer: Self-pay | Admitting: Nephrology

## 2016-03-25 ENCOUNTER — Ambulatory Visit (HOSPITAL_COMMUNITY)
Admission: RE | Admit: 2016-03-25 | Discharge: 2016-03-25 | Disposition: A | Discharge status: Home / Self Care | Payer: BLUE CROSS/BLUE SHIELD | Source: Ambulatory Visit | Attending: Nephrology | Admitting: Nephrology

## 2016-03-25 ENCOUNTER — Encounter (HOSPITAL_COMMUNITY): Payer: Self-pay

## 2016-03-25 DIAGNOSIS — Q613 Polycystic kidney, unspecified: Secondary | ICD-10-CM

## 2016-03-25 LAB — CBC
HCT: 41.8 % (ref 36.0–46.0)
Hemoglobin: 14.3 g/dL (ref 12.0–15.0)
MCH: 31.1 pg (ref 26.0–34.0)
MCHC: 34.2 g/dL (ref 30.0–36.0)
MCV: 90.9 fL (ref 78.0–100.0)
Platelets: 156 10*3/uL (ref 150–400)
RBC: 4.6 MIL/uL (ref 3.87–5.11)
RDW: 13.4 % (ref 11.5–15.5)
WBC: 5.7 10*3/uL (ref 4.0–10.5)

## 2016-03-25 LAB — PROTIME-INR
INR: 0.97
Prothrombin Time: 12.8 seconds (ref 11.4–15.2)

## 2016-03-25 LAB — APTT: aPTT: 25 seconds (ref 24–36)

## 2016-03-25 MED ORDER — LIDOCAINE HCL 1 % IJ SOLN
INTRAMUSCULAR | Status: AC
  Filled 2016-03-25: qty 20

## 2016-03-25 MED ORDER — SODIUM CHLORIDE 0.9 % IV SOLN: INTRAVENOUS | Status: DC

## 2016-03-25 MED ORDER — FENTANYL CITRATE (PF) 100 MCG/2ML IJ SOLN
INTRAMUSCULAR | Status: AC
  Filled 2016-03-25: qty 4

## 2016-03-25 MED ORDER — SODIUM CHLORIDE 0.9 % IV SOLN
INTRAVENOUS | Status: AC | PRN
  Administered 2016-03-25: 10 mL/h via INTRAVENOUS

## 2016-03-25 MED ORDER — MIDAZOLAM HCL 2 MG/2ML IJ SOLN
INTRAMUSCULAR | Status: AC | PRN
  Administered 2016-03-25 (×2): 0.5 mg via INTRAVENOUS
  Administered 2016-03-25: 1 mg via INTRAVENOUS
  Administered 2016-03-25: 0.5 mg via INTRAVENOUS
  Administered 2016-03-25: 1 mg via INTRAVENOUS

## 2016-03-25 MED ORDER — MIDAZOLAM HCL 2 MG/2ML IJ SOLN
INTRAMUSCULAR | Status: AC
  Filled 2016-03-25: qty 4

## 2016-03-25 MED ORDER — FENTANYL CITRATE (PF) 100 MCG/2ML IJ SOLN
INTRAMUSCULAR | Status: AC | PRN
  Administered 2016-03-25: 50 ug via INTRAVENOUS
  Administered 2016-03-25 (×2): 25 ug via INTRAVENOUS
  Administered 2016-03-25: 50 ug via INTRAVENOUS
  Administered 2016-03-25: 25 ug via INTRAVENOUS

## 2016-03-25 NOTE — Sedation Documentation (Signed)
132ml drained from 2 left cysts. 357mls drained from 3 right cysts.

## 2016-03-25 NOTE — Procedures (Signed)
Interventional Radiology Procedure Note  Procedure: US guided aspiration of bilateral kidney cysts, for therapeutic aspiration.   3 cysts aspirated on the right, with ~300cc clear fluid 2 cysts aspirated at the inferior left kidney near site of greatest pain.  ~200cc clear fluid No sample No EBL .  Complications: No Recommendations:  - Ok to shower tomorrow - 1 hour recover - advance diet - Do not submerge for 7 days - Routine wound care   Signed,  Dulcy Fanny. Earleen Newport, DO

## 2016-03-25 NOTE — Sedation Documentation (Signed)
Called to give report. Nurse unavailable. Will call back 

## 2016-03-25 NOTE — Discharge Instructions (Addendum)
Needle Aspiration, Care After These instructions give you information about caring for yourself after your procedure. Your doctor may also give you more specific instructions. Call your doctor if you have any problems or questions after your procedure. Follow these instructions at home:  Rest as told by your doctor.  Take medicines only as told by your doctor.  There are many different ways to close and cover the aspiration site, including stitches (sutures), skin glue, and adhesive strips. Follow instructions from your doctor about:  How to take care of your aspiration site.  When and how you should change your bandage (dressing).  When you should remove your dressing.  Removing whatever was used to close your aspiration site.  Check your aspiration site every day for signs of infection. Watch for:  Redness, swelling, or pain.  Fluid, blood, or pus. Contact a doctor if:  You have a fever.  You have redness, swelling, or pain at the aspiration site, and it lasts longer than a few days.  You have fluid, blood, or pus coming from the biopsy site.  You feel sick to your stomach (nauseous).  You throw up (vomit). Get help right away if:  You are short of breath.  You have trouble breathing.  Your chest hurts.  You feel dizzy or you pass out (faint).  You have bleeding that does not stop with pressure or a bandage.  You cough up blood.  Your belly (abdomen) hurts. This information is not intended to replace advice given to you by your health care provider. Make sure you discuss any questions you have with your health care provider. Document Released: 12/21/2007 Document Revised: 06/15/2015 Document Reviewed: 01/03/2014 Elsevier Interactive Patient Education  2017 [employer]key 03/25/2016, 9:50 AM Moderate Conscious Sedation, Adult, Care After These instructions provide you with information about caring for yourself after your procedure.  Your health care provider may also give you more specific instructions. Your treatment has been planned according to current medical practices, but problems sometimes occur. Call your health care provider if you have any problems or questions after your procedure. What can I expect after the procedure? After your procedure, it is common:  To feel sleepy for several hours.  To feel clumsy and have poor balance for several hours.  To have poor judgment for several hours.  To vomit if you eat too soon. Follow these instructions at home: For at least 24 hours after the procedure:    Do not:  Participate in activities where you could fall or become injured.  Drive.  Use heavy machinery.  Drink alcohol.  Take sleeping pills or medicines that cause drowsiness.  Make important decisions or sign legal documents.  Take care of children on your own.  Rest. Eating and drinking   Follow the diet recommended by your health care provider.  If you vomit:  Drink water, juice, or soup when you can drink without vomiting.  Make sure you have little or no nausea before eating solid foods. General instructions   Have a responsible adult stay with you until you are awake and alert.  Take over-the-counter and prescription medicines only as told by your health care provider.  If you smoke, do not smoke without supervision.  Keep all follow-up visits as told by your health care provider. This is important. Contact a health care provider if:  You keep feeling nauseous or you keep vomiting.  You feel light-headed.  You develop  a rash.  You have a fever. Get help right away if:  You have trouble breathing. This information is not intended to replace advice given to you by your health care provider. Make sure you discuss any questions you have with your health care provider. Document Released: 10/28/2012 Document Revised: 06/12/2015 Document Reviewed: 04/29/2015 Elsevier  Interactive Patient Education  2017 [employer].

## 2016-03-25 NOTE — H&P (Signed)
Chief Complaint: Patient was seen in consultation today for bilateral renal cysts  Referring Physician(s):  Dr. Edrick Oh  Supervising Physician: Corrie Mckusick  Patient Status: [facility] - Out-pt  History of Present Illness: Debbie Ward is a 56 y.o. female with history of appendectomy and cholecystectomy who presents with complaint of back pain related to polycystic renal disease.  Patient has had cyst aspirations in the past with good relief of her back pain. Her last aspiration in this department was 10/20/2014.  IR consulted for bilateral renal cysts aspiration at the request of Dr. Justin Mend.  Patient has been NPO.  She does not take blood thinners.  She has been in her usual state of health.   Past Medical History:  Diagnosis Date  . Polycystic renal disease     Past Surgical History:  Procedure Laterality Date  . ABDOMINAL HYSTERECTOMY     partial  . APPENDECTOMY    . CHOLECYSTECTOMY      Allergies: Percocet [oxycodone-acetaminophen]  Medications: Prior to Admission medications   Medication Sig Start Date End Date Taking? Authorizing Provider  acetaminophen (TYLENOL) 650 MG CR tablet Take 1,300 mg by mouth daily as needed for pain.   Yes Historical Provider, MD  ibuprofen (ADVIL,MOTRIN) 200 MG tablet Take 400 mg by mouth daily as needed for pain.    Yes Historical Provider, MD  lisinopril-hydrochlorothiazide (PRINZIDE,ZESTORETIC) 10-12.5 MG per tablet Take 1 tablet by mouth daily.   Yes Historical Provider, MD     Family History  Problem Relation Age of Onset  . Polycystic kidney disease Mother   . Cancer - Lung Father     Social History   Social History  . Marital status: Married    Spouse name: N/A  . Number of children: N/A  . Years of education: N/A   Social History Main Topics  . Smoking status: Never Smoker  . Smokeless tobacco: Never Used  . Alcohol use No  . Drug use: No  . Sexual activity: Not Asked   Other Topics Concern  . None    Social History Narrative  . None     Review of Systems  Constitutional: Negative for fatigue and fever.  Respiratory: Negative for cough and shortness of breath.   Cardiovascular: Negative for chest pain.  Musculoskeletal: Positive for back pain.  Psychiatric/Behavioral: Negative for behavioral problems and confusion.    Vital Signs: BP 104/64   Pulse 63   Temp 98.2 F (36.8 C) (Oral)   Resp 16   Ht 5\' 4"  (1.626 m)   Wt 201 lb (91.2 kg)   SpO2 98%   BMI 34.50 kg/m   Physical Exam  Constitutional: She is oriented to person, place, and time. She appears well-developed.  Cardiovascular: Normal rate, regular rhythm and normal heart sounds.   Pulmonary/Chest: Effort normal and breath sounds normal. No respiratory distress.  Abdominal: Soft.  Neurological: She is alert and oriented to person, place, and time.  Skin: Skin is warm and dry.  Psychiatric: She has a normal mood and affect. Her behavior is normal. Judgment and thought content normal.  Nursing note and vitals reviewed.   Mallampati Score:  MD Evaluation Airway: WNL Heart: WNL Abdomen: WNL Chest/ Lungs: WNL ASA  Classification: 3 Mallampati/Airway Score: Two  Imaging: No results found.  Labs:  CBC:  Recent Labs  03/25/16 0600  WBC 5.7  HGB 14.3  HCT 41.8  PLT 156    COAGS:  Recent Labs  03/25/16 0600  INR  0.97  APTT 25    BMP: No results for input(s): NA, K, CL, CO2, GLUCOSE, BUN, CALCIUM, CREATININE, GFRNONAA, GFRAA in the last 8760 hours.  Invalid input(s): CMP  LIVER FUNCTION TESTS: No results for input(s): BILITOT, AST, ALT, ALKPHOS, PROT, ALBUMIN in the last 8760 hours.  TUMOR MARKERS: No results for input(s): AFPTM, CEA, CA199, CHROMGRNA in the last 8760 hours.  Assessment and Plan: Patient with history of bilateral renal cysts s/p multiple aspirations in the past presents with back pain.  IR consulted for bilateral renal cysts aspiration at the request of Dr. Justin Mend.   Patient has been NPO.  She does not take blood thinners.  She has been in her usual state of health.  Labs and medications reviewed.  Risks and Benefits discussed with the patient including, but not limited to bleeding, infection, or damage to adjacent structures. All of the patient's questions were answered, patient is agreeable to proceed. Consent signed and in chart.   Thank you for this interesting consult.  I greatly enjoyed meeting Debbie Ward and look forward to participating in their care.  A copy of this report was sent to the requesting provider on this date.  Electronically Signed: Docia Barrier 03/25/2016, 7:48 AM   I spent a total of    15 Minutes in face to face in clinical consultation, greater than 50% of which was counseling/coordinating care for bilateral renal cysts.

## 2016-05-01 DIAGNOSIS — M6208 Separation of muscle (nontraumatic), other site: Secondary | ICD-10-CM

## 2016-05-01 DIAGNOSIS — R12 Heartburn: Secondary | ICD-10-CM

## 2016-05-01 HISTORY — DX: Heartburn: R12

## 2016-05-01 HISTORY — DX: Separation of muscle (nontraumatic), other site: M62.08

## 2017-03-17 ENCOUNTER — Other Ambulatory Visit (HOSPITAL_COMMUNITY): Payer: Self-pay | Admitting: Nephrology

## 2017-03-17 DIAGNOSIS — Q613 Polycystic kidney, unspecified: Secondary | ICD-10-CM

## 2017-03-20 ENCOUNTER — Other Ambulatory Visit: Payer: Self-pay | Admitting: Radiology

## 2017-03-21 ENCOUNTER — Other Ambulatory Visit: Payer: Self-pay | Admitting: Radiology

## 2017-03-24 ENCOUNTER — Encounter (HOSPITAL_COMMUNITY): Payer: Self-pay

## 2017-03-24 ENCOUNTER — Other Ambulatory Visit (HOSPITAL_COMMUNITY): Payer: Self-pay | Admitting: Nephrology

## 2017-03-24 ENCOUNTER — Ambulatory Visit (HOSPITAL_COMMUNITY)
Admission: RE | Admit: 2017-03-24 | Discharge: 2017-03-24 | Disposition: A | Discharge status: Home / Self Care | Payer: Self-pay | Source: Ambulatory Visit | Attending: Nephrology | Admitting: Nephrology

## 2017-03-24 DIAGNOSIS — Z841 Family history of disorders of kidney and ureter: Secondary | ICD-10-CM | POA: Insufficient documentation

## 2017-03-24 DIAGNOSIS — Z885 Allergy status to narcotic agent status: Secondary | ICD-10-CM | POA: Insufficient documentation

## 2017-03-24 DIAGNOSIS — Q613 Polycystic kidney, unspecified: Secondary | ICD-10-CM

## 2017-03-24 DIAGNOSIS — Z9049 Acquired absence of other specified parts of digestive tract: Secondary | ICD-10-CM | POA: Insufficient documentation

## 2017-03-24 DIAGNOSIS — Z801 Family history of malignant neoplasm of trachea, bronchus and lung: Secondary | ICD-10-CM | POA: Insufficient documentation

## 2017-03-24 DIAGNOSIS — Z79899 Other long term (current) drug therapy: Secondary | ICD-10-CM | POA: Insufficient documentation

## 2017-03-24 LAB — CBC
HCT: 43.4 % (ref 36.0–46.0)
Hemoglobin: 15 g/dL (ref 12.0–15.0)
MCH: 32 pg (ref 26.0–34.0)
MCHC: 34.6 g/dL (ref 30.0–36.0)
MCV: 92.5 fL (ref 78.0–100.0)
Platelets: 155 10*3/uL (ref 150–400)
RBC: 4.69 MIL/uL (ref 3.87–5.11)
RDW: 13.2 % (ref 11.5–15.5)
WBC: 5.9 10*3/uL (ref 4.0–10.5)

## 2017-03-24 LAB — PROTIME-INR
INR: 0.98
Prothrombin Time: 12.9 seconds (ref 11.4–15.2)

## 2017-03-24 MED ORDER — FENTANYL CITRATE (PF) 100 MCG/2ML IJ SOLN
INTRAMUSCULAR | Status: AC
  Filled 2017-03-24: qty 4

## 2017-03-24 MED ORDER — HYDROCODONE-ACETAMINOPHEN 5-325 MG PO TABS
1.0000 | ORAL_TABLET | ORAL | Status: DC | PRN
  Administered 2017-03-24: 2 via ORAL

## 2017-03-24 MED ORDER — SODIUM CHLORIDE 0.9 % IV SOLN: INTRAVENOUS | Status: DC

## 2017-03-24 MED ORDER — MIDAZOLAM HCL 2 MG/2ML IJ SOLN
INTRAMUSCULAR | Status: AC
  Filled 2017-03-24: qty 4

## 2017-03-24 MED ORDER — FENTANYL CITRATE (PF) 100 MCG/2ML IJ SOLN
INTRAMUSCULAR | Status: AC | PRN
  Administered 2017-03-24 (×5): 25 ug via INTRAVENOUS
  Administered 2017-03-24: 50 ug via INTRAVENOUS

## 2017-03-24 MED ORDER — MIDAZOLAM HCL 2 MG/2ML IJ SOLN
INTRAMUSCULAR | Status: AC | PRN
  Administered 2017-03-24 (×4): 0.5 mg via INTRAVENOUS
  Administered 2017-03-24: 1 mg via INTRAVENOUS
  Administered 2017-03-24: 0.5 mg via INTRAVENOUS

## 2017-03-24 MED ORDER — LIDOCAINE HCL (PF) 1 % IJ SOLN
INTRAMUSCULAR | Status: AC
  Filled 2017-03-24: qty 30

## 2017-03-24 MED ORDER — HYDROCODONE-ACETAMINOPHEN 5-325 MG PO TABS
ORAL_TABLET | ORAL | Status: AC
  Filled 2017-03-24: qty 2

## 2017-03-24 NOTE — Discharge Instructions (Addendum)
Needle Biopsy, Care After °Refer to this sheet in the next few weeks. These instructions provide you with information about caring for yourself after your procedure. Your health care provider may also give you more specific instructions. Your treatment has been planned according to current medical practices, but problems sometimes occur. Call your health care provider if you have any problems or questions after your procedure. °What can I expect after the procedure? °After your procedure, it is common to have soreness, bruising, or mild pain at the biopsy site. This should go away in a few days. °Follow these instructions at home: °· Rest as directed by your health care provider. °· Take medicines only as directed by your health care provider. °· There are many different ways to close and cover the biopsy site, including stitches (sutures), skin glue, and adhesive strips. Follow your health care provider's instructions about: °? Biopsy site care. °? Bandage (dressing) changes and removal. °? Biopsy site closure removal. °· Check your biopsy site every day for signs of infection. Watch for: °? Redness, swelling, or pain. °? Fluid, blood, or pus. °Contact a health care provider if: °· You have a fever. °· You have redness, swelling, or pain at the biopsy site that lasts longer than a few days. °· You have fluid, blood, or pus coming from the biopsy site. °· You feel nauseous. °· You vomit. °Get help right away if: °· You have shortness of breath. °· You have trouble breathing. °· You have chest pain. °· You feel dizzy or you faint. °· You have bleeding that does not stop with pressure or a bandage. °· You cough up blood. °· You have pain in your abdomen. °This information is not intended to replace advice given to you by your health care provider. Make sure you discuss any questions you have with your health care provider. °Document Released: 05/24/2014 Document Revised: 06/15/2015 Document Reviewed:  01/03/2014 °Elsevier Interactive Patient Education © 2018 Else[employer] Inc[employer] °Moderate Conscious Sedation, Adult, Care After °These instructions provide you with information about caring for yourself after your procedure. Your health care provider may also give you more specific instructions. Your treatment has been planned according to current medical practices, but problems sometimes occur. Call your health care provider if you have any problems or questions after your procedure. °What can I expect after the procedure? °After your procedure, it is common: °· To feel sleepy for several hours. °· To feel clumsy and have poor balance for several hours. °· To have poor judgment for several hours. °· To vomit if you eat too soon. ° °Follow these instructions at home: °For at least 24 hours after the procedure: ° °· Do not: °? Participate in activities where you could fall or become injured. °? Drive. °? Use heavy machinery. °? Drink alcohol. °? Take sleeping pills or medicines that cause drowsiness. °? Make important decisions or sign legal documents. °? Take care of children on your own. °· Rest. °Eating and drinking °· Follow the diet recommended by your health care provider. °· If you vomit: °? Drink water, juice, or soup when you can drink without vomiting. °? Make sure you have little or no nausea before eating solid foods. °General instructions °· Have a responsible adult stay with you until you are awake and alert. °· Take over-the-counter and prescription medicines only as told by your health care provider. °· If you smoke, do not smoke without supervision. °· Keep all follow-up visits as told by your health care   provider. This is important. °Contact a health care provider if: °· You keep feeling nauseous or you keep vomiting. °· You feel light-headed. °· You develop a rash. °· You have a fever. °Get help right away if: °· You have trouble breathing. °This information is not intended to replace advice given to you  by your health care provider. Make sure you discuss any questions you have with your health care provider. °Document Released: 10/28/2012 Document Revised: 06/12/2015 Document Reviewed: 04/29/2015 °Elsevier Interactive Patient Education © 2018 Elsevier Inc. ° °

## 2017-03-24 NOTE — Sedation Documentation (Signed)
Patient is resting comfortably. 

## 2017-03-24 NOTE — Procedures (Signed)
  Pre-operative Diagnosis: Polycystic kidney disease, symptomatic cysts       Post-operative Diagnosis: Polycystic kidney disease, symptomatic cysts.  Innumerable cysts   Indications: Flank pain  Procedure: US guided aspiration of bilateral renal cysts.  Findings: Innumerable bilateral renal cysts.  Aspirated multiple cysts bilaterally.  Removed 800 ml of yellow fluid from left renal cysts and 550 ml of yellow fluid from right renal cysts.  Complications: No immediate      EBL: Minimal  Plan: Bedrest 2 hours, then discharge to home.

## 2017-03-24 NOTE — H&P (Signed)
Chief Complaint: Patient was seen in consultation today for renal cyst aspiration at the request of Webb,Martin  Referring Physician(s): Webb,Martin  Supervising Physician: Markus Daft  Patient Status: [facility] - Out-pt  History of Present Illness: Debbie Ward is a 57 y.o. female with hx of polycystic kidney disease. She has large symptomatic bilateral renal cysts. She is referred for US guided aspiration, which she has had multiple times. The last was 03/2016. Marland KitchenPMHx, meds, labs, imaging reviewed. Has been NPO this am. Family at bedside.  Past Medical History:  Diagnosis Date  . Polycystic renal disease     Past Surgical History:  Procedure Laterality Date  . ABDOMINAL HYSTERECTOMY     partial  . APPENDECTOMY    . CHOLECYSTECTOMY      Allergies: Percocet [oxycodone-acetaminophen]  Medications: Prior to Admission medications   Medication Sig Start Date End Date Taking? Authorizing Provider  acetaminophen (TYLENOL) 650 MG CR tablet Take 1,300 mg by mouth daily as needed for pain.   Yes [provider]  ibuprofen (ADVIL,MOTRIN) 200 MG tablet Take 400 mg by mouth daily as needed for mild pain or moderate pain.    Yes [provider]  lisinopril-hydrochlorothiazide (PRINZIDE,ZESTORETIC) 20-12.5 MG tablet Take 1 tablet by mouth daily. 03/17/17  Yes [provider]     Family History  Problem Relation Age of Onset  . Polycystic kidney disease Mother   . Cancer - Lung Father     Social History   Socioeconomic History  . Marital status: Married    Spouse name: None  . Number of children: None  . Years of education: None  . Highest education level: None  Social Needs  . Financial resource strain: None  . Food insecurity - worry: None  . Food insecurity - inability: None  . Transportation needs - medical: None  . Transportation needs - non-medical: None  Occupational History  . None  Tobacco Use  . Smoking status: Never Smoker  .  Smokeless tobacco: Never Used  Substance and Sexual Activity  . Alcohol use: No  . Drug use: No  . Sexual activity: None  Other Topics Concern  . None  Social History Narrative  . None     Review of Systems: A 12 point ROS discussed and pertinent positives are indicated in the HPI above.  All other systems are negative.  Review of Systems  Vital Signs: BP (!) 111/59   Pulse (!) 52   Temp 98 F (36.7 C) (Oral)   Resp 16   Ht 5\' 4"  (1.626 m)   Wt 197 lb (89.4 kg)   SpO2 98%   BMI 33.81 kg/m   Physical Exam  Constitutional: She is oriented to person, place, and time. She appears well-developed. No distress.  HENT:  Head: Normocephalic.  Mouth/Throat: Oropharynx is clear and moist.  Neck: Normal range of motion. No tracheal deviation present.  Cardiovascular: Normal rate, regular rhythm and normal heart sounds.  Pulmonary/Chest: Effort normal and breath sounds normal. No respiratory distress.  Abdominal: Soft. She exhibits no mass. There is no tenderness.  Neurological: She is alert and oriented to person, place, and time.  Skin: Skin is warm and dry.  Psychiatric: She has a normal mood and affect.    Imaging: No results found.  Labs:  CBC: Recent Labs    03/25/16 0600  WBC 5.7  HGB 14.3  HCT 41.8  PLT 156    COAGS: Recent Labs    03/25/16 0600 03/24/17 0947  INR 0.97 0.98  APTT 25  --     BMP: No results for input(s): NA, K, CL, CO2, GLUCOSE, BUN, CALCIUM, CREATININE, GFRNONAA, GFRAA in the last 8760 hours.  Invalid input(s): CMP  LIVER FUNCTION TESTS: No results for input(s): BILITOT, AST, ALT, ALKPHOS, PROT, ALBUMIN in the last 8760 hours.  TUMOR MARKERS: No results for input(s): AFPTM, CEA, CA199, CHROMGRNA in the last 8760 hours.  Assessment and Plan: Bilateral symptomatic renal cysts. For US guided aspiration Labs pending. Risks and benefits discussed with the patient including, but not limited to bleeding, infection, damage to  adjacent structures or low yield requiring additional tests.  All of the patient's questions were answered, patient is agreeable to proceed. Consent signed and in chart.    Thank you for this interesting consult.  I greatly enjoyed meeting DEVANEE POMPLUN and look forward to participating in their care.  A copy of this report was sent to the requesting provider on this date.  Electronically Signed: Ascencion Dike, PA-C 03/24/2017, 7:16 AM   I spent a total of 20 minutes in face to face in clinical consultation, greater than 50% of which was counseling/coordinating care for renal cyst aspiration

## 2017-10-01 ENCOUNTER — Other Ambulatory Visit: Payer: Self-pay | Admitting: Nephrology

## 2017-10-01 ENCOUNTER — Ambulatory Visit
Admission: RE | Admit: 2017-10-01 | Discharge: 2017-10-01 | Disposition: A | Discharge status: Home / Self Care | Payer: BLUE CROSS/BLUE SHIELD | Source: Ambulatory Visit | Attending: Nephrology | Admitting: Nephrology

## 2017-10-01 DIAGNOSIS — R0600 Dyspnea, unspecified: Secondary | ICD-10-CM

## 2017-10-10 ENCOUNTER — Ambulatory Visit: Payer: BLUE CROSS/BLUE SHIELD | Admitting: Cardiology

## 2017-10-10 ENCOUNTER — Encounter (INDEPENDENT_AMBULATORY_CARE_PROVIDER_SITE_OTHER): Payer: Self-pay

## 2017-10-10 ENCOUNTER — Encounter: Payer: Self-pay | Admitting: *Deleted

## 2017-10-10 ENCOUNTER — Encounter: Payer: Self-pay | Admitting: Cardiology

## 2017-10-10 VITALS — BP 110/70 | HR 50 | Ht 64.0 in | Wt 208.1 lb

## 2017-10-10 DIAGNOSIS — Q613 Polycystic kidney, unspecified: Secondary | ICD-10-CM | POA: Diagnosis not present

## 2017-10-10 DIAGNOSIS — R0600 Dyspnea, unspecified: Secondary | ICD-10-CM | POA: Diagnosis not present

## 2017-10-10 DIAGNOSIS — R0789 Other chest pain: Secondary | ICD-10-CM | POA: Diagnosis not present

## 2017-10-10 DIAGNOSIS — Z8249 Family history of ischemic heart disease and other diseases of the circulatory system: Secondary | ICD-10-CM

## 2017-10-10 NOTE — Progress Notes (Signed)
Cardiology Office Note:    Date:  10/10/2017   ID:  Debbie Ward, DOB Aug 21, 1960, MRN [id number]  PCP:  Ray Church, NP  Cardiologist:  No primary care provider on file.  Electrophysiologist:  None   Referring MD: Edrick Oh, MD     History of Present Illness:    Debbie Ward is a 57 y.o. female here for evaluation of shortness of breath at the request of Dr. Justin Mend of nephrology.  In review of prior note, she has been having increasing dyspnea.  Nothing to suggest PE or pulmonary disease.  Serum creatinine remains 1.4-1.6.  She does have polycystic kidney disease.  She has noted increasing weight gain over 6 months and worsening dyspnea on exertion and fatigue.  She has had some painting with flight of stairs.  She enjoys kayaking but she feels that her endurance has diminished.  Increasing lower extremity edema as well.  No NSAIDs.  A chest x-ray was performed on 10/01/2017 and was normal. Feels mild pressure in chest, when climbing on trail. Felt like nausea, cough, fanning her. ?dehydrated.   Her biological father had MI, CABG/ family history of CAD.  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  Her granddaughter lives with her has special needs.  States that about 10 years ago she had a cardiac work-up, remembers having mitral valve prolapse.  Past Medical History:  Diagnosis Date  . Polycystic renal disease     Past Surgical History:  Procedure Laterality Date  . ABDOMINAL HYSTERECTOMY     partial  . APPENDECTOMY    . CHOLECYSTECTOMY      Current Medications: Current Meds  Medication Sig  . acetaminophen (TYLENOL) 650 MG CR tablet Take 1,300 mg by mouth daily as needed for pain.  Marland Kitchen ibuprofen (ADVIL,MOTRIN) 200 MG tablet Take 400 mg by mouth daily as needed for mild pain or moderate pain.   Marland Kitchen lisinopril-hydrochlorothiazide (PRINZIDE,ZESTORETIC) 20-12.5 MG tablet Take 1 tablet by mouth daily.     Allergies:   Percocet [oxycodone-acetaminophen]    Social History   Socioeconomic History  . Marital status: Married    Spouse name: Not on file  . Number of children: Not on file  . Years of education: Not on file  . Highest education level: Not on file  Occupational History  . Not on file  Social Needs  . Financial resource strain: Not on file  . Food insecurity:    Worry: Not on file    Inability: Not on file  . Transportation needs:    Medical: Not on file    Non-medical: Not on file  Tobacco Use  . Smoking status: Never Smoker  . Smokeless tobacco: Never Used  Substance and Sexual Activity  . Alcohol use: No  . Drug use: No  . Sexual activity: Not on file  Lifestyle  . Physical activity:    Days per week: Not on file    Minutes per session: Not on file  . Stress: Not on file  Relationships  . Social connections:    Talks on phone: Not on file    Gets together: Not on file    Attends religious service: Not on file    Active member of club or organization: Not on file    Attends meetings of clubs or organizations: Not on file    Relationship status: Not on file  Other Topics Concern  . Not on file  Social History Narrative  . Not on file  Family History: The patient's family history includes Cancer - Lung in her father; Polycystic kidney disease in her mother.  ROS:   Please see the history of present illness.     All other systems reviewed and are negative.  EKGs/Labs/Other Studies Reviewed:    The following studies were reviewed today: Prior office notes, lab work, EKG reviewed  EKG:  EKG is  ordered today.  The ekg ordered today demonstrates 10/10/2017-sinus bradycardia 50 (normal has a slow heart rate), no ischemic changes personally reviewed and interpreted.  Recent Labs: 03/24/2017: Hemoglobin 15.0; Platelets 155  Recent Lipid Panel No results found for: CHOL, TRIG, HDL, CHOLHDL, VLDL, LDLCALC, LDLDIRECT  Physical Exam:    VS:  BP 110/70   Pulse (!) 50   Ht 5\' 4"  (1.626 m)   Wt 208 lb  1.9 oz (94.4 kg)   SpO2 98%   BMI 35.72 kg/m     Wt Readings from Last 3 Encounters:  10/10/17 208 lb 1.9 oz (94.4 kg)  03/24/17 197 lb (89.4 kg)  03/25/16 201 lb (91.2 kg)     GEN:  Well nourished, well developed in no acute distress, overweight HEENT: Normal NECK: No JVD; No carotid bruits LYMPHATICS: No lymphadenopathy CARDIAC: RRR, no murmurs, rubs, gallops RESPIRATORY:  Clear to auscultation without rales, wheezing or rhonchi  ABDOMEN: Soft, non-tender, non-distended MUSCULOSKELETAL: Trace bilateral lower extremity edema; No deformity  SKIN: Warm and dry NEUROLOGIC:  Alert and oriented x 3 PSYCHIATRIC:  Normal affect   ASSESSMENT:    1. Dyspnea, unspecified type   2. Family history of cardiovascular disease   3. Chest pressure   4. Polycystic kidney disease   5. Morbid obesity ([place])    PLAN:    In order of problems listed above:  Atypical chest pressure/shortness of breath with activity/family history of CAD, father - We will check a nuclear stress test to evaluate for ischemia. - We will check an echocardiogram to ensure proper structure and function of her heart.  Previously had been diagnosed with mitral valve prolapse she believes 10 years ago. - If cardiac work-up is reassuring, continue with conditioning efforts, weight loss, mild diuretics per Dr. Justin Mend.  Polycystic kidney disease - Dr. Justin Mend has been monitoring closely.  Family history of CAD - Biological father had CABG, MI  Morbid obesity - BMI greater than 35 with 2 or more comorbidities.  Continue to encourage weight loss.  Caloric restriction, decrease carbohydrates.    Medication Adjustments/Labs and Tests Ordered: Current medicines are reviewed at length with the patient today.  Concerns regarding medicines are outlined above.  Orders Placed This Encounter  Procedures  . MYOCARDIAL PERFUSION IMAGING  . EKG 12-Lead  . ECHOCARDIOGRAM COMPLETE   No orders of the defined types were placed in  this encounter.   Patient Instructions  Medication Instructions:  The current medical regimen is effective;  continue present plan and medications.  Testing/Procedures: Your physician has requested that you have an echocardiogram. Echocardiography is a painless test that uses sound waves to create images of your heart. It provides your doctor with information about the size and shape of your heart and how well your heart's chambers and valves are working. This procedure takes approximately one hour. There are no restrictions for this procedure.  Your physician has requested that you have a myoview. For further information please visit [email or web address]. Please follow instruction sheet, as given.  Follow-Up: Follow up as needed after the above testing.  Thank you for choosing [facility]!!         Signed, Candee Furbish, MD  10/10/2017 11:34 AM    [facility]

## 2017-10-10 NOTE — Patient Instructions (Signed)
Medication Instructions:  The current medical regimen is effective;  continue present plan and medications.  Testing/Procedures: Your physician has requested that you have an echocardiogram. Echocardiography is a painless test that uses sound waves to create images of your heart. It provides your doctor with information about the size and shape of your heart and how well your heart's chambers and valves are working. This procedure takes approximately one hour. There are no restrictions for this procedure.  Your physician has requested that you have a myoview. For further information please visit [email or web address]. Please follow instruction sheet, as given.  Follow-Up: Follow up as needed after the above testing.  Thank you for choosing [facility]!!

## 2017-10-27 ENCOUNTER — Telehealth (HOSPITAL_COMMUNITY): Payer: Self-pay

## 2017-10-27 NOTE — Telephone Encounter (Signed)
Pt called to get instructions for her test on 10/30/2017. They were given and pt stated she understood and would be here for her test. S.Monseratt Ledin EMTP

## 2017-10-30 ENCOUNTER — Ambulatory Visit (HOSPITAL_COMMUNITY): Payer: BLUE CROSS/BLUE SHIELD | Attending: Cardiovascular Disease

## 2017-10-30 ENCOUNTER — Other Ambulatory Visit: Payer: Self-pay

## 2017-10-30 ENCOUNTER — Ambulatory Visit (HOSPITAL_BASED_OUTPATIENT_CLINIC_OR_DEPARTMENT_OTHER): Payer: BLUE CROSS/BLUE SHIELD

## 2017-10-30 DIAGNOSIS — R0600 Dyspnea, unspecified: Secondary | ICD-10-CM

## 2017-10-30 DIAGNOSIS — Z8249 Family history of ischemic heart disease and other diseases of the circulatory system: Secondary | ICD-10-CM

## 2017-10-30 LAB — MYOCARDIAL PERFUSION IMAGING
LV dias vol: 62 mL (ref 46–106)
LV sys vol: 20 mL
Peak HR: 99 {beats}/min
Rest HR: 56 {beats}/min
SDS: 2
SRS: 0
SSS: 2
TID: 1.04

## 2017-10-30 LAB — ECHOCARDIOGRAM COMPLETE
Height: 64 in
Weight: 3328 oz

## 2017-10-30 MED ORDER — TECHNETIUM TC 99M TETROFOSMIN IV KIT
10.5000 | PACK | Freq: Once | INTRAVENOUS | Status: AC | PRN
  Administered 2017-10-30: 10.5 via INTRAVENOUS
  Filled 2017-10-30: qty 11

## 2017-10-30 MED ORDER — REGADENOSON 0.4 MG/5ML IV SOLN
0.4000 mg | Freq: Once | INTRAVENOUS | Status: AC
  Administered 2017-10-30: 0.4 mg via INTRAVENOUS

## 2017-10-30 MED ORDER — TECHNETIUM TC 99M TETROFOSMIN IV KIT
32.4000 | PACK | Freq: Once | INTRAVENOUS | Status: AC | PRN
  Administered 2017-10-30: 32.4 via INTRAVENOUS
  Filled 2017-10-30: qty 33

## 2018-09-19 DIAGNOSIS — N941 Unspecified dyspareunia: Secondary | ICD-10-CM

## 2018-09-19 HISTORY — DX: Unspecified dyspareunia: N94.10

## 2018-10-13 ENCOUNTER — Other Ambulatory Visit (HOSPITAL_COMMUNITY): Payer: Self-pay | Admitting: Nephrology

## 2018-10-13 DIAGNOSIS — Q6102 Congenital multiple renal cysts: Secondary | ICD-10-CM

## 2018-10-13 DIAGNOSIS — Q613 Polycystic kidney, unspecified: Secondary | ICD-10-CM

## 2018-10-22 ENCOUNTER — Other Ambulatory Visit: Payer: Self-pay | Admitting: Radiology

## 2018-10-26 ENCOUNTER — Other Ambulatory Visit: Payer: Self-pay | Admitting: Radiology

## 2018-10-26 ENCOUNTER — Ambulatory Visit (HOSPITAL_COMMUNITY)
Admission: RE | Admit: 2018-10-26 | Discharge: 2018-10-26 | Disposition: A | Discharge status: Home / Self Care | Payer: BC Managed Care – PPO | Source: Ambulatory Visit | Attending: Nephrology | Admitting: Nephrology

## 2018-10-26 ENCOUNTER — Other Ambulatory Visit: Payer: Self-pay

## 2018-10-26 ENCOUNTER — Other Ambulatory Visit (HOSPITAL_COMMUNITY): Payer: Self-pay | Admitting: Nephrology

## 2018-10-26 ENCOUNTER — Encounter (HOSPITAL_COMMUNITY): Payer: Self-pay

## 2018-10-26 DIAGNOSIS — Q6102 Congenital multiple renal cysts: Secondary | ICD-10-CM

## 2018-10-26 DIAGNOSIS — Q613 Polycystic kidney, unspecified: Secondary | ICD-10-CM

## 2018-10-26 LAB — CBC
HCT: 43.5 % (ref 36.0–46.0)
Hemoglobin: 15 g/dL (ref 12.0–15.0)
MCH: 32.1 pg (ref 26.0–34.0)
MCHC: 34.5 g/dL (ref 30.0–36.0)
MCV: 92.9 fL (ref 80.0–100.0)
Platelets: 152 10*3/uL (ref 150–400)
RBC: 4.68 MIL/uL (ref 3.87–5.11)
RDW: 12.4 % (ref 11.5–15.5)
WBC: 5.6 10*3/uL (ref 4.0–10.5)
nRBC: 0 % (ref 0.0–0.2)

## 2018-10-26 LAB — PROTIME-INR
INR: 1 (ref 0.8–1.2)
Prothrombin Time: 12.6 seconds (ref 11.4–15.2)

## 2018-10-26 MED ORDER — LIDOCAINE HCL (PF) 1 % IJ SOLN
INTRAMUSCULAR | Status: AC
  Filled 2018-10-26: qty 30

## 2018-10-26 MED ORDER — HYDROCODONE-ACETAMINOPHEN 5-325 MG PO TABS
1.0000 | ORAL_TABLET | Freq: Once | ORAL | Status: AC
  Administered 2018-10-26: 1 via ORAL
  Filled 2018-10-26: qty 1

## 2018-10-26 MED ORDER — MIDAZOLAM HCL 2 MG/2ML IJ SOLN
INTRAMUSCULAR | Status: AC
  Filled 2018-10-26: qty 2

## 2018-10-26 MED ORDER — FENTANYL CITRATE (PF) 100 MCG/2ML IJ SOLN
INTRAMUSCULAR | Status: AC
  Filled 2018-10-26: qty 2

## 2018-10-26 MED ORDER — SODIUM CHLORIDE 0.9 % IV SOLN: INTRAVENOUS | Status: DC

## 2018-10-26 MED ORDER — MIDAZOLAM HCL 2 MG/2ML IJ SOLN
INTRAMUSCULAR | Status: AC | PRN
  Administered 2018-10-26: 1 mg via INTRAVENOUS
  Administered 2018-10-26 (×2): 0.5 mg via INTRAVENOUS

## 2018-10-26 MED ORDER — FENTANYL CITRATE (PF) 100 MCG/2ML IJ SOLN
INTRAMUSCULAR | Status: AC | PRN
  Administered 2018-10-26: 25 ug via INTRAVENOUS
  Administered 2018-10-26: 50 ug via INTRAVENOUS

## 2018-10-26 NOTE — Discharge Instructions (Signed)
Call Dr Jason Nest office if any problems, questions or concerns; call if any bleeding, drainage, fever, pain, swelling or redness at either site on your back    Moderate Conscious Sedation, Adult, Care After These instructions provide you with information about caring for yourself after your procedure. Your health care provider may also give you more specific instructions. Your treatment has been planned according to current medical practices, but problems sometimes occur. Call your health care provider if you have any problems or questions after your procedure. What can I expect after the procedure? After your procedure, it is common:  To feel sleepy for several hours.  To feel clumsy and have poor balance for several hours.  To have poor judgment for several hours.  To vomit if you eat too soon. Follow these instructions at home: For at least 24 hours after the procedure:   Do not: ? Participate in activities where you could fall or become injured. ? Drive. ? Use heavy machinery. ? Drink alcohol. ? Take sleeping pills or medicines that cause drowsiness. ? Make important decisions or sign legal documents. ? Take care of children on your own.  Rest. Eating and drinking  Follow the diet recommended by your health care provider.  If you vomit: ? Drink water, juice, or soup when you can drink without vomiting. ? Make sure you have little or no nausea before eating solid foods. General instructions  Have a responsible adult stay with you until you are awake and alert.  Take over-the-counter and prescription medicines only as told by your health care provider.  If you smoke, do not smoke without supervision.  Keep all follow-up visits as told by your health care provider. This is important. Contact a health care provider if:  You keep feeling nauseous or you keep vomiting.  You feel light-headed.  You develop a rash.  You have a fever. Get help right away if:  You  have trouble breathing. This information is not intended to replace advice given to you by your health care provider. Make sure you discuss any questions you have with your health care provider. Document Released: 10/28/2012 Document Revised: 12/20/2016 Document Reviewed: 04/29/2015 Elsevier Patient Education  2020 [employer].

## 2018-10-26 NOTE — H&P (Signed)
Chief Complaint: Patient was seen in consultation today for bilateral renal cysts aspiration at the request of Webb,Martin  Referring Physician(s): Webb,Martin  Supervising Physician: Jacqulynn Cadet  Patient Status: [facility] - Out-pt  History of Present Illness: Debbie Ward is a 58 y.o. female   Known polycystic renal disease Has had aspirations for painful cyst many times in IR Most recent 03/2017:   The largest exophytic cysts were aspirated in both kidneys. Approximately 800 mL was removed from the left kidney and 550 mL from the right kidney  Has had worsening pain for few months Now scheduled for renal cysts aspiration   Past Medical History:  Diagnosis Date  . Polycystic renal disease     Past Surgical History:  Procedure Laterality Date  . ABDOMINAL HYSTERECTOMY     partial  . APPENDECTOMY    . CHOLECYSTECTOMY      Allergies: Percocet [oxycodone-acetaminophen]  Medications: Prior to Admission medications   Medication Sig Start Date End Date Taking? Authorizing Provider  acetaminophen (TYLENOL) 650 MG CR tablet Take 1,300 mg by mouth daily as needed for pain.   Yes [provider]  ibuprofen (ADVIL,MOTRIN) 200 MG tablet Take 400 mg by mouth daily as needed for mild pain or moderate pain.    Yes [provider]  lisinopril-hydrochlorothiazide (PRINZIDE,ZESTORETIC) 20-12.5 MG tablet Take 1 tablet by mouth daily. 03/17/17  Yes [provider]     Family History  Problem Relation Age of Onset  . Polycystic kidney disease Mother   . Cancer - Lung Father     Social History   Socioeconomic History  . Marital status: Married    Spouse name: Not on file  . Number of children: Not on file  . Years of education: Not on file  . Highest education level: Not on file  Occupational History  . Not on file  Social Needs  . Financial resource strain: Not on file  . Food insecurity    Worry: Not on file    Inability: Not on file   . Transportation needs    Medical: Not on file    Non-medical: Not on file  Tobacco Use  . Smoking status: Never Smoker  . Smokeless tobacco: Never Used  Substance and Sexual Activity  . Alcohol use: No  . Drug use: No  . Sexual activity: Not on file  Lifestyle  . Physical activity    Days per week: Not on file    Minutes per session: Not on file  . Stress: Not on file  Relationships  . Social Herbalist on phone: Not on file    Gets together: Not on file    Attends religious service: Not on file    Active member of club or organization: Not on file    Attends meetings of clubs or organizations: Not on file    Relationship status: Not on file  Other Topics Concern  . Not on file  Social History Narrative  . Not on file     Review of Systems: A 12 point ROS discussed and pertinent positives are indicated in the HPI above.  All other systems are negative.  Review of Systems  Constitutional: Negative for activity change, fatigue and fever.  Respiratory: Negative for cough and shortness of breath.   Cardiovascular: Negative for chest pain.  Gastrointestinal: Negative for abdominal pain.  Musculoskeletal: Positive for back pain.  Psychiatric/Behavioral: Negative for behavioral problems and confusion.    Vital Signs: BP  123/73   Pulse (!) 59   Temp 98 F (36.7 C) (Skin)   Resp 16   Ht 5\' 4"  (1.626 m)   Wt 180 lb (81.6 kg)   SpO2 100%   BMI 30.90 kg/m   Physical Exam Vitals signs reviewed.  Cardiovascular:     Rate and Rhythm: Normal rate and regular rhythm.     Heart sounds: Normal heart sounds.  Pulmonary:     Breath sounds: Normal breath sounds.  Abdominal:     Palpations: Abdomen is soft.  Musculoskeletal: Normal range of motion.  Skin:    General: Skin is warm and dry.  Neurological:     Mental Status: She is alert and oriented to person, place, and time.  Psychiatric:        Mood and Affect: Mood normal.        Behavior: Behavior normal.         Thought Content: Thought content normal.        Judgment: Judgment normal.     Imaging: No results found.  Labs:  CBC: No results for input(s): WBC, HGB, HCT, PLT in the last 8760 hours.  COAGS: No results for input(s): INR, APTT in the last 8760 hours.  BMP: No results for input(s): NA, K, CL, CO2, GLUCOSE, BUN, CALCIUM, CREATININE, GFRNONAA, GFRAA in the last 8760 hours.  Invalid input(s): CMP  LIVER FUNCTION TESTS: No results for input(s): BILITOT, AST, ALT, ALKPHOS, PROT, ALBUMIN in the last 8760 hours.  TUMOR MARKERS: No results for input(s): AFPTM, CEA, CA199, CHROMGRNA in the last 8760 hours.  Assessment and Plan:  Polycystic renal disease Symptomatic- pain from same Relief with periodic aspirations-- most recent 03/2017 in IR Scheduled now for same Pt is aware of procedure risks and benefits Including but not limited to Infection; bleeding; organ damage Agreeable to proceed Consent signed in chart  Thank you for this interesting consult.  I greatly enjoyed meeting Debbie Ward and look forward to participating in their care.  A copy of this report was sent to the requesting provider on this date.  Electronically Signed: Lavonia Drafts, PA-C 10/26/2018, 12:06 PM   I spent a total of  30 Minutes   in face to face in clinical consultation, greater than 50% of which was counseling/coordinating care for painful Bilateral renal cysts

## 2018-10-26 NOTE — Procedures (Signed)
Interventional Radiology Procedure Note  Procedure: US guided aspiration of bilateral symptomatic renal cysts.   Complications: None.   Estimated Blood Loss: None.   Recommendations: - Bedrest x 2 hrs - DC home   Signed,  Criselda Peaches, MD

## 2018-12-10 ENCOUNTER — Other Ambulatory Visit (HOSPITAL_COMMUNITY): Payer: Self-pay | Admitting: Nephrology

## 2018-12-10 ENCOUNTER — Other Ambulatory Visit: Payer: Self-pay | Admitting: Nephrology

## 2018-12-10 DIAGNOSIS — N183 Chronic kidney disease, stage 3 unspecified: Secondary | ICD-10-CM

## 2018-12-10 DIAGNOSIS — R42 Dizziness and giddiness: Secondary | ICD-10-CM

## 2018-12-28 ENCOUNTER — Ambulatory Visit (HOSPITAL_COMMUNITY): Admission: RE | Admit: 2018-12-28 | Payer: BC Managed Care – PPO | Source: Ambulatory Visit

## 2018-12-28 ENCOUNTER — Encounter (HOSPITAL_COMMUNITY): Payer: Self-pay

## 2019-03-15 ENCOUNTER — Other Ambulatory Visit: Payer: Self-pay | Admitting: Neurosurgery

## 2019-03-15 ENCOUNTER — Other Ambulatory Visit (HOSPITAL_COMMUNITY): Payer: Self-pay | Admitting: Neurosurgery

## 2019-03-15 DIAGNOSIS — D329 Benign neoplasm of meninges, unspecified: Secondary | ICD-10-CM

## 2019-04-05 ENCOUNTER — Ambulatory Visit (HOSPITAL_COMMUNITY): Admission: RE | Admit: 2019-04-05 | Payer: BC Managed Care – PPO | Source: Ambulatory Visit

## 2019-04-05 ENCOUNTER — Encounter (HOSPITAL_COMMUNITY): Payer: Self-pay

## 2019-09-08 ENCOUNTER — Ambulatory Visit (HOSPITAL_COMMUNITY)
Admission: RE | Admit: 2019-09-08 | Discharge: 2019-09-08 | Disposition: A | Discharge status: Home / Self Care | Payer: BC Managed Care – PPO | Source: Ambulatory Visit | Attending: Neurosurgery | Admitting: Neurosurgery

## 2019-09-08 ENCOUNTER — Other Ambulatory Visit: Payer: Self-pay

## 2019-09-08 DIAGNOSIS — D329 Benign neoplasm of meninges, unspecified: Secondary | ICD-10-CM | POA: Diagnosis present

## 2019-09-08 MED ORDER — GADOBUTROL 1 MMOL/ML IV SOLN
8.0000 mL | Freq: Once | INTRAVENOUS | Status: AC | PRN
  Administered 2019-09-08: 8 mL via INTRAVENOUS

## 2020-01-28 ENCOUNTER — Encounter (HOSPITAL_COMMUNITY): Payer: Self-pay | Admitting: Radiology

## 2020-01-28 ENCOUNTER — Other Ambulatory Visit (HOSPITAL_COMMUNITY): Payer: Self-pay | Admitting: Nephrology

## 2020-01-28 DIAGNOSIS — Q613 Polycystic kidney, unspecified: Secondary | ICD-10-CM

## 2020-01-28 NOTE — Progress Notes (Signed)
RS           Joleena L. [address] Female, 60 y.o., March 06, 1960  MRN:  [id number] Phone:  [phone] Jerilynn Mages)       PCP:  Ray Church, NP Coverage:  Sherre Poot [employer] Ppo            RE: US Aspiration Received: Today Arne Cleveland, MD  Jillyn Hidden  Ok   US aspiration bilat  See mult prior studies  No cytology    DDH        Previous Messages   ----- Message -----  From: Garth Bigness D  Sent: 01/28/2020 12:43 PM EST  To: Ir Procedure Requests  Subject: US Aspiration                   Procedure: US Aspiration   Reason: Aspiration of both kidneys, PCK   History: no prior imaging, per order Aspiration is done yearly   Provider: Edrick Oh   Provider Contact: [phone]

## 2020-01-31 ENCOUNTER — Encounter (HOSPITAL_COMMUNITY): Payer: Self-pay

## 2020-01-31 ENCOUNTER — Ambulatory Visit (HOSPITAL_COMMUNITY)
Admission: RE | Admit: 2020-01-31 | Discharge: 2020-01-31 | Disposition: A | Discharge status: Home / Self Care | Payer: BC Managed Care – PPO | Source: Ambulatory Visit | Attending: Nephrology | Admitting: Nephrology

## 2020-01-31 ENCOUNTER — Other Ambulatory Visit (HOSPITAL_COMMUNITY): Payer: Self-pay | Admitting: Nephrology

## 2020-01-31 ENCOUNTER — Other Ambulatory Visit: Payer: Self-pay

## 2020-01-31 DIAGNOSIS — Q613 Polycystic kidney, unspecified: Secondary | ICD-10-CM | POA: Diagnosis present

## 2020-01-31 MED ORDER — SODIUM CHLORIDE 0.9 % IV SOLN: INTRAVENOUS | Status: DC

## 2020-01-31 MED ORDER — MIDAZOLAM HCL 2 MG/2ML IJ SOLN
INTRAMUSCULAR | Status: AC | PRN
  Administered 2020-01-31 (×4): 0.5 mg via INTRAVENOUS
  Administered 2020-01-31: 1 mg via INTRAVENOUS

## 2020-01-31 MED ORDER — FENTANYL CITRATE (PF) 100 MCG/2ML IJ SOLN
INTRAMUSCULAR | Status: AC
  Filled 2020-01-31: qty 2

## 2020-01-31 MED ORDER — MIDAZOLAM HCL 2 MG/2ML IJ SOLN
INTRAMUSCULAR | Status: AC
  Filled 2020-01-31: qty 2

## 2020-01-31 MED ORDER — LIDOCAINE HCL (PF) 1 % IJ SOLN
INTRAMUSCULAR | Status: AC
  Filled 2020-01-31: qty 30

## 2020-01-31 MED ORDER — FENTANYL CITRATE (PF) 100 MCG/2ML IJ SOLN
INTRAMUSCULAR | Status: AC | PRN
  Administered 2020-01-31 (×2): 25 ug via INTRAVENOUS
  Administered 2020-01-31: 50 ug via INTRAVENOUS
  Administered 2020-01-31 (×2): 25 ug via INTRAVENOUS

## 2020-01-31 NOTE — Sedation Documentation (Signed)
Total fluid aspirated 700 ml Left Kidney- 400 ml Right Kidney- 300 ml

## 2020-01-31 NOTE — Discharge Instructions (Addendum)
Moderate Conscious Sedation, Adult Sedation is the use of medicines to promote relaxation and to relieve discomfort and anxiety. Moderate conscious sedation is a type of sedation. Under moderate conscious sedation, you are less alert than normal, but you are still able to respond to instructions, touch, or both. Moderate conscious sedation is used during short medical and dental procedures. It is milder than deep sedation, which is a type of sedation under which you cannot be easily woken up. It is also milder than general anesthesia, which is the use of medicines to make you unconscious. Moderate conscious sedation allows you to return to your regular activities sooner. Tell a health care provider about:  Any allergies you have.  All medicines you are taking, including vitamins, herbs, eye drops, creams, and over-the-counter medicines.  Any use of steroids. This includes steroids taken by mouth or as a cream.  Any problems you or family members have had with sedatives and anesthetic medicines.  Any blood disorders you have.  Any surgeries you have had.  Any medical conditions you have, such as sleep apnea.  Whether you are pregnant or may be pregnant.  Any use of cigarettes, alcohol, marijuana, or drugs. What are the risks? Generally, this is a safe procedure. However, problems may occur, including:  Getting too much medicine (oversedation).  Nausea.  Allergic reaction to medicines.  Trouble breathing. If this happens, a breathing tube may be used. It will be removed when you are awake and breathing on your own.  Heart trouble.  Lung trouble.  Confusion that gets better with time (emergence delirium). What happens before the procedure? Staying hydrated Follow instructions from your health care provider about hydration, which may include:  Up to 2 hours before the procedure - you may continue to drink clear liquids, such as water, clear fruit juice, black coffee, and plain  tea. Eating and drinking restrictions Follow instructions from your health care provider about eating and drinking, which may include:  8 hours before the procedure - stop eating heavy meals or foods, such as meat, fried foods, or fatty foods.  6 hours before the procedure - stop eating light meals or foods, such as toast or cereal.  6 hours before the procedure - stop drinking milk or drinks that contain milk.  2 hours before the procedure - stop drinking clear liquids. Medicines Ask your health care provider about:  Changing or stopping your regular medicines. This is especially important if you are taking diabetes medicines or blood thinners.  Taking medicines such as aspirin and ibuprofen. These medicines can thin your blood. Do not take these medicines unless your health care provider tells you to take them.  Taking over-the-counter medicines, vitamins, herbs, and supplements. Tests and exams  You will have a physical exam.  You may have blood tests done to show how well: ? Your kidneys and liver work. ? Your blood clots. General i Needle Biopsy, Care After These instructions tell you how to care for yourself after your procedure. Your doctor may also give you more specific instructions. Call your doctor if you have any problems or questions. What can I expect after the procedure? After the procedure, it is common to have:  Soreness.  Bruising.  Mild pain. Follow these instructions at home:  Return to your normal activities as told by your doctor. Ask your doctor what activities are safe for you.  Take over-the-counter and prescription medicines only as told by your doctor.  Wash your hands with soap and  water before you change your bandage (dressing). If you cannot use soap and water, use hand sanitizer.  Follow instructions from your doctor about: ? How to take care of your puncture site. ? When and how to change your bandage. ? When to remove your  bandage.  Check your puncture site every day for signs of infection. Watch for: ? Redness, swelling, or pain. ? Fluid or blood. ? Pus or a bad smell. ? Warmth.  Do not take baths, swim, or use a hot tub until your doctor approves. Ask your doctor if you may take showers. You may only be allowed to take sponge baths.  Keep all follow-up visits as told by your doctor. This is important.   Contact a doctor if you have:  A fever.  Redness, swelling, or pain at the puncture site, and it lasts longer than a few days.  Fluid, blood, or pus coming from the puncture site.  Warmth coming from the puncture site. Get help right away if:  You have a lot of bleeding from the puncture site. Summary  After the procedure, it is common to have soreness, bruising, or mild pain at the puncture site.  Check your puncture site every day for signs of infection, such as redness, swelling, or pain.  Get help right away if you have severe bleeding from your puncture site. This information is not intended to replace advice given to you by your health care provider. Make sure you discuss any questions you have with your health care provider. Document Revised: 07/08/2019 Document Reviewed: 07/08/2019 Elsevier Patient Education  2021 [place]. nstructions  Plan to have a responsible adult take you home from the hospital or clinic.  If you will be going home right after the procedure, plan to have a responsible adult care for you for the time you are told. This is important. What happens during the procedure?  You will be given the sedative. The sedative may be given: ? As a pill that you will swallow. It can also be inserted into the rectum. ? As a spray through the nose. ? As an injection into the muscle. ? As an injection into the vein through an IV.  You may be given oxygen as needed.  Your breathing, heart rate, and blood pressure will be monitored during the procedure.  The medical or  dental procedure will be done. The procedure may vary among health care providers and hospitals.   What happens after the procedure?  Your blood pressure, heart rate, breathing rate, and blood oxygen level will be monitored until you leave the hospital or clinic.  You will get fluids through your IV if needed.  Do not drive or operate machinery until your health care provider says that it is safe. Summary  Sedation is the use of medicines to promote relaxation and to relieve discomfort and anxiety. Moderate conscious sedation is a type of sedation that is used during short medical and dental procedures.  Tell the health care provider about any medical conditions that you have and about all the medicines that you are taking.  You will be given the sedative as a pill, a spray through the nose, an injection into the muscle, or an injection into the vein through an IV. Vital signs are monitored during the sedation.  Moderate conscious sedation allows you to return to your regular activities sooner. This information is not intended to replace advice given to you by your health care provider. Make sure you  discuss any questions you have with your health care provider. Document Revised: 05/07/2019 Document Reviewed: 12/03/2018 Elsevier Patient Education  2021 [employer].

## 2020-01-31 NOTE — Procedures (Signed)
Interventional Radiology Procedure:   Indications: Polycystic kidney disease  Procedure: US guided aspiration of bilateral renal cysts  Findings: Innumerable renal cysts.  Aspirated 400 ml of clear yellow fluid from left kidney cysts and 300 ml from right kidney cysts.  Complications: None     EBL: less than 10 ml  Plan: Bedrest 2 hours   Nychelle Cassata R. Anselm Pancoast, MD  Pager: [phone]

## 2020-01-31 NOTE — Sedation Documentation (Signed)
Wound sites assessed. No drainage from dressings bilaterally. Soft upon palpation

## 2020-01-31 NOTE — H&P (Signed)
Chief Complaint: Patient was seen in consultation today for bilateral renal cyst aspiration at the request of Webb,Martin  Referring Physician(s): Webb,Martin  Supervising Physician: Markus Daft  Patient Status: [facility] - Out-pt  History of Present Illness: Debbie Ward is a 60 y.o. female   Known to IR Hx polycystic renal disease Has had Bilat cysts aspiration every 1 or 1.5 yrs 03/25/16; 10/01/17; 10/26/18 Good relief  Has developed Bilat back pain -- progressively worsening x 3-4 months No new imaging Approved with Dr Vernard Gambles for [place] evaluation and aspiration     Past Medical History:  Diagnosis Date  . Polycystic renal disease     Past Surgical History:  Procedure Laterality Date  . ABDOMINAL HYSTERECTOMY     partial  . APPENDECTOMY    . CHOLECYSTECTOMY      Allergies: Percocet [oxycodone-acetaminophen]  Medications: Prior to Admission medications   Medication Sig Start Date End Date Taking? Authorizing Provider  acetaminophen (TYLENOL) 650 MG CR tablet Take 1,300 mg by mouth daily as needed for pain.   Yes [provider]  ibuprofen (ADVIL,MOTRIN) 200 MG tablet Take 400 mg by mouth daily as needed for mild pain or moderate pain.    Yes [provider]  lisinopril-hydrochlorothiazide (PRINZIDE,ZESTORETIC) 20-12.5 MG tablet Take 1 tablet by mouth daily. 03/17/17  Yes [provider]     Family History  Problem Relation Age of Onset  . Polycystic kidney disease Mother   . Cancer - Lung Father     Social History   Socioeconomic History  . Marital status: Married    Spouse name: Not on file  . Number of children: Not on file  . Years of education: Not on file  . Highest education level: Not on file  Occupational History  . Not on file  Tobacco Use  . Smoking status: Never Smoker  . Smokeless tobacco: Never Used  Vaping Use  . Vaping Use: Never used  Substance and Sexual Activity  . Alcohol use: No  . Drug use: No  .  Sexual activity: Not on file  Other Topics Concern  . Not on file  Social History Narrative  . Not on file   Social Determinants of Health   Financial Resource Strain: Not on file  Food Insecurity: Not on file  Transportation Needs: Not on file  Physical Activity: Not on file  Stress: Not on file  Social Connections: Not on file     Review of Systems: A 12 point ROS discussed and pertinent positives are indicated in the HPI above.  All other systems are negative.  Review of Systems  Constitutional: Negative for activity change, fatigue and fever.  Respiratory: Negative for cough and shortness of breath.   Cardiovascular: Negative for chest pain.  Musculoskeletal: Positive for back pain.  Neurological: Negative for weakness.  Psychiatric/Behavioral: Negative for behavioral problems and confusion.    Vital Signs: BP (!) 152/79   Pulse (!) 55   Temp 98 F (36.7 C) (Oral)   Resp 18   Ht 5\' 4"  (1.626 m)   Wt 198 lb (89.8 kg)   SpO2 100%   BMI 33.99 kg/m   Physical Exam Vitals reviewed.  HENT:     Mouth/Throat:     Mouth: Mucous membranes are moist.  Cardiovascular:     Rate and Rhythm: Normal rate and regular rhythm.     Heart sounds: Normal heart sounds.  Pulmonary:     Effort: Pulmonary effort is normal.  Breath sounds: Normal breath sounds.  Abdominal:     Palpations: Abdomen is soft.  Musculoskeletal:        General: Normal range of motion.  Skin:    General: Skin is warm.  Neurological:     Mental Status: She is alert and oriented to person, place, and time.  Psychiatric:        Behavior: Behavior normal.     Imaging: No results found.  Labs:  CBC: No results for input(s): WBC, HGB, HCT, PLT in the last 8760 hours.  COAGS: No results for input(s): INR, APTT in the last 8760 hours.  BMP: No results for input(s): NA, K, CL, CO2, GLUCOSE, BUN, CALCIUM, CREATININE, GFRNONAA, GFRAA in the last 8760 hours.  Invalid input(s): CMP  LIVER  FUNCTION TESTS: No results for input(s): BILITOT, AST, ALT, ALKPHOS, PROT, ALBUMIN in the last 8760 hours.  TUMOR MARKERS: No results for input(s): AFPTM, CEA, CA199, CHROMGRNA in the last 8760 hours.  Assessment and Plan:  Polycystic kidney disease Has had Bilat cyst aspiration every 1- 1.5 yrs since 2018 Good relief with aspiration Scheduled now for same Pt is aware of procedure benefits and risks- including but limited to Infection; bleeding; damage to surrounding structures. Agreeable to proceed Consent signed and in chart  Thank you for this interesting consult.  I greatly enjoyed meeting Debbie Ward and look forward to participating in their care.  A copy of this report was sent to the requesting provider on this date.  Electronically Signed: Lavonia Drafts, PA-C 01/31/2020, 12:35 PM   I spent a total of    25 Minutes in face to face in clinical consultation, greater than 50% of which was counseling/coordinating care for bilateral renal cysts aspiration

## 2021-03-06 ENCOUNTER — Other Ambulatory Visit (HOSPITAL_COMMUNITY): Payer: Self-pay | Admitting: Nephrology

## 2021-03-06 DIAGNOSIS — Q613 Polycystic kidney, unspecified: Secondary | ICD-10-CM

## 2021-03-06 DIAGNOSIS — N281 Cyst of kidney, acquired: Secondary | ICD-10-CM

## 2021-03-08 ENCOUNTER — Other Ambulatory Visit: Payer: Self-pay

## 2021-03-08 ENCOUNTER — Ambulatory Visit
Admission: RE | Admit: 2021-03-08 | Discharge: 2021-03-08 | Disposition: A | Discharge status: Home / Self Care | Payer: BC Managed Care – PPO | Source: Ambulatory Visit | Attending: Nephrology | Admitting: Nephrology

## 2021-03-08 ENCOUNTER — Encounter: Payer: Self-pay | Admitting: *Deleted

## 2021-03-08 ENCOUNTER — Other Ambulatory Visit (HOSPITAL_COMMUNITY): Payer: Self-pay | Admitting: Nephrology

## 2021-03-08 ENCOUNTER — Telehealth: Payer: BC Managed Care – PPO

## 2021-03-08 DIAGNOSIS — Q613 Polycystic kidney, unspecified: Secondary | ICD-10-CM

## 2021-03-08 DIAGNOSIS — N281 Cyst of kidney, acquired: Secondary | ICD-10-CM

## 2021-03-08 HISTORY — PX: IR RADIOLOGIST EVAL & MGMT: IMG5224

## 2021-03-08 NOTE — Progress Notes (Signed)
Chief Complaint: Patient was consulted remotely today (TeleHealth) for discussion of renal cyst aspiration  Referring Physician(s): Webb,Martin  History of Present Illness: Debbie Ward is a 61 y.o. female with polycystic kidney disease and has had multiple ultrasound-guided renal cyst aspiration procedures.  The most recent renal cyst aspiration procedure was on 01/31/2020.  Patient reports pain relief after the aspirations but she has been hesitant to schedule another aspiration because she has had significant discomfort during the procedures recently.  She has not been adequately sedated for the last couple procedures.  Patient is having significant abdominal and back discomfort associated with her renal cysts at this time.  She feels that some of the cysts may be compressing on nerves.  She denies hematuria or urinary symptoms.  According to the patient, her GFR is in the mid 71s.  No other significant medical problems or complaints at this time.  Past Medical History:  Diagnosis Date   Polycystic renal disease     Past Surgical History:  Procedure Laterality Date   ABDOMINAL HYSTERECTOMY     partial   APPENDECTOMY     CHOLECYSTECTOMY      Allergies: Percocet [oxycodone-acetaminophen]  Medications: Prior to Admission medications   Medication Sig Start Date End Date Taking? Authorizing Provider  acetaminophen (TYLENOL) 650 MG CR tablet Take 1,300 mg by mouth daily as needed for pain.    [provider]  ibuprofen (ADVIL,MOTRIN) 200 MG tablet Take 400 mg by mouth daily as needed for mild pain or moderate pain.     [provider]  lisinopril-hydrochlorothiazide (PRINZIDE,ZESTORETIC) 20-12.5 MG tablet Take 1 tablet by mouth daily. 03/17/17   [provider]     Family History  Problem Relation Age of Onset   Polycystic kidney disease Mother    Cancer - Lung Father     Social History   Socioeconomic History   Marital status: Married     Spouse name: Not on file   Number of children: Not on file   Years of education: Not on file   Highest education level: Not on file  Occupational History   Not on file  Tobacco Use   Smoking status: Never   Smokeless tobacco: Never  Vaping Use   Vaping Use: Never used  Substance and Sexual Activity   Alcohol use: No   Drug use: No   Sexual activity: Not on file  Other Topics Concern   Not on file  Social History Narrative   Not on file   Social Determinants of Health   Financial Resource Strain: Not on file  Food Insecurity: Not on file  Transportation Needs: Not on file  Physical Activity: Not on file  Stress: Not on file  Social Connections: Not on file     Review of Systems  Gastrointestinal:  Positive for abdominal pain.  Genitourinary:  Negative for hematuria.  Musculoskeletal:  Positive for back pain.     Physical Exam No direct physical exam was performed Vital Signs: There were no vitals taken for this visit.  Imaging: No results found.  Labs:  CBC: No results for input(s): WBC, HGB, HCT, PLT in the last 8760 hours.  COAGS: No results for input(s): INR, APTT in the last 8760 hours.  BMP: No results for input(s): NA, K, CL, CO2, GLUCOSE, BUN, CALCIUM, CREATININE, GFRNONAA, GFRAA in the last 8760 hours.  Invalid input(s): CMP  LIVER FUNCTION TESTS: No results for input(s): BILITOT, AST, ALT, ALKPHOS, PROT, ALBUMIN in  the last 8760 hours.  TUMOR MARKERS: No results for input(s): AFPTM, CEA, CA199, CHROMGRNA in the last 8760 hours.  Assessment and Plan:  60 year old with polycystic kidney disease and symptomatic bilateral renal cysts.  Patient has had multiple ultrasound-guided renal cyst aspirations in the past.  She gets good relief following the aspiration procedures but she now has recurrent symptoms.  Patient does not have any questions about the ultrasound-guided aspiration procedure but she would like [place] to make her more comfortable during  the procedure.  She has been awake and very uncomfortable for the last couple of procedures with moderate sedation.  We will schedule the patient for ultrasound-guided aspiration of bilateral renal cysts with moderate sedation.   Electronically Signed: Burman Riis 03/08/2021, 2:13 PM   I spent a total of    5 Minutes in remote  clinical consultation, greater than 50% of which was counseling/coordinating care for ultrasound-guided renal cyst aspiration.    Visit type: Audio only (telephone). Audio (no video) only due to patient preference. Alternative for in-person consultation at [facility], [address], [place], [place]. This visit type was conducted due to national recommendations for restrictions regarding the COVID-19 Pandemic (e.g. social distancing).  This format is felt to be most appropriate for this patient at this time.  All issues noted in this document were discussed and addressed.   Patient ID: Debbie Ward, female   DOB: 1960-04-28, 61 y.o.   MRN: [id number]

## 2021-03-09 ENCOUNTER — Other Ambulatory Visit (HOSPITAL_COMMUNITY): Payer: Self-pay | Admitting: Nephrology

## 2021-03-09 DIAGNOSIS — Q613 Polycystic kidney, unspecified: Secondary | ICD-10-CM

## 2021-03-09 DIAGNOSIS — N281 Cyst of kidney, acquired: Secondary | ICD-10-CM

## 2021-03-14 ENCOUNTER — Other Ambulatory Visit: Payer: Self-pay | Admitting: Nephrology

## 2021-03-14 DIAGNOSIS — Q613 Polycystic kidney, unspecified: Secondary | ICD-10-CM

## 2021-03-16 ENCOUNTER — Other Ambulatory Visit (HOSPITAL_COMMUNITY): Payer: Self-pay | Admitting: Physician Assistant

## 2021-03-19 ENCOUNTER — Encounter (HOSPITAL_COMMUNITY): Payer: Self-pay

## 2021-03-19 ENCOUNTER — Ambulatory Visit (HOSPITAL_COMMUNITY)
Admission: RE | Admit: 2021-03-19 | Discharge: 2021-03-19 | Disposition: A | Discharge status: Home / Self Care | Payer: BC Managed Care – PPO | Source: Ambulatory Visit | Attending: Nephrology | Admitting: Nephrology

## 2021-03-19 DIAGNOSIS — Q613 Polycystic kidney, unspecified: Secondary | ICD-10-CM

## 2021-03-19 DIAGNOSIS — M549 Dorsalgia, unspecified: Secondary | ICD-10-CM | POA: Diagnosis present

## 2021-03-19 DIAGNOSIS — N281 Cyst of kidney, acquired: Secondary | ICD-10-CM

## 2021-03-19 LAB — PROTIME-INR
INR: 0.9 (ref 0.8–1.2)
Prothrombin Time: 12.5 seconds (ref 11.4–15.2)

## 2021-03-19 LAB — CBC
HCT: 46.2 % — ABNORMAL HIGH (ref 36.0–46.0)
Hemoglobin: 15.6 g/dL — ABNORMAL HIGH (ref 12.0–15.0)
MCH: 31.5 pg (ref 26.0–34.0)
MCHC: 33.8 g/dL (ref 30.0–36.0)
MCV: 93.3 fL (ref 80.0–100.0)
Platelets: 174 10*3/uL (ref 150–400)
RBC: 4.95 MIL/uL (ref 3.87–5.11)
RDW: 12.8 % (ref 11.5–15.5)
WBC: 6 10*3/uL (ref 4.0–10.5)
nRBC: 0 % (ref 0.0–0.2)

## 2021-03-19 MED ORDER — SODIUM CHLORIDE 0.9 % IV SOLN: INTRAVENOUS | Status: DC

## 2021-03-19 MED ORDER — FENTANYL CITRATE (PF) 100 MCG/2ML IJ SOLN
INTRAMUSCULAR | Status: AC
  Filled 2021-03-19: qty 4

## 2021-03-19 MED ORDER — SODIUM CHLORIDE 0.9 % IV SOLN
INTRAVENOUS | Status: AC | PRN
  Administered 2021-03-19: 10 mL/h via INTRAVENOUS

## 2021-03-19 MED ORDER — LIDOCAINE HCL (PF) 1 % IJ SOLN
INTRAMUSCULAR | Status: AC
  Filled 2021-03-19: qty 30

## 2021-03-19 MED ORDER — MIDAZOLAM HCL 2 MG/2ML IJ SOLN
INTRAMUSCULAR | Status: AC
  Filled 2021-03-19: qty 4

## 2021-03-19 MED ORDER — MIDAZOLAM HCL 2 MG/2ML IJ SOLN
INTRAMUSCULAR | Status: AC | PRN
  Administered 2021-03-19 (×3): .5 mg via INTRAVENOUS
  Administered 2021-03-19: 1 mg via INTRAVENOUS

## 2021-03-19 MED ORDER — FENTANYL CITRATE (PF) 100 MCG/2ML IJ SOLN
INTRAMUSCULAR | Status: AC | PRN
  Administered 2021-03-19 (×3): 25 ug via INTRAVENOUS
  Administered 2021-03-19: 50 ug via INTRAVENOUS

## 2021-03-19 NOTE — H&P (Addendum)
Chief Complaint: Patient was seen in consultation today for Bilateral renal cyst aspiration at the request of Webb,Martin  Referring Physician(s): Webb,Martin  Supervising Physician: Arne Cleveland  Patient Status: [facility] - Out-pt  History of Present Illness: Debbie Ward is a 61 y.o. female    Known to IR Polycystic renal disease Has had aspirations of these renal cysts many times  Most recent 01/31/2020 400 cc from left; 300 cc from right Pt states last procedure was extremely painful-- spoke to Dr Anselm Pancoast regarding this issue 03/08/2021. **note in chart** Has even delayed scheduling this appointment secondary pain experienced last visit. Has noted back pain now 6-7 months--- cannot delay any longer secondary pain. Commonly does get pain relief from procedure  Scheduled today for Bilateral renal cyst aspiration   Past Medical History:  Diagnosis Date   Polycystic renal disease     Past Surgical History:  Procedure Laterality Date   ABDOMINAL HYSTERECTOMY     partial   APPENDECTOMY     CHOLECYSTECTOMY     IR RADIOLOGIST EVAL & MGMT  03/08/2021    Allergies: Percocet [oxycodone-acetaminophen]  Medications: Prior to Admission medications   Medication Sig Start Date End Date Taking? Authorizing Provider  acetaminophen (TYLENOL) 650 MG CR tablet Take 1,300 mg by mouth daily as needed for pain.   Yes [provider]  ibuprofen (ADVIL,MOTRIN) 200 MG tablet Take 400 mg by mouth daily as needed for mild pain or moderate pain.    Yes [provider]  lisinopril-hydrochlorothiazide (PRINZIDE,ZESTORETIC) 20-12.5 MG tablet Take 1 tablet by mouth daily. 03/17/17  Yes [provider]     Family History  Problem Relation Age of Onset   Polycystic kidney disease Mother    Cancer - Lung Father     Social History   Socioeconomic History   Marital status: Married    Spouse name: Not on file   Number of children: Not on file   Years of  education: Not on file   Highest education level: Not on file  Occupational History   Not on file  Tobacco Use   Smoking status: Never   Smokeless tobacco: Never  Vaping Use   Vaping Use: Never used  Substance and Sexual Activity   Alcohol use: No   Drug use: No   Sexual activity: Not on file  Other Topics Concern   Not on file  Social History Narrative   Not on file   Social Determinants of Health   Financial Resource Strain: Not on file  Food Insecurity: Not on file  Transportation Needs: Not on file  Physical Activity: Not on file  Stress: Not on file  Social Connections: Not on file    Review of Systems: A 12 point ROS discussed and pertinent positives are indicated in the HPI above.  All other systems are negative.  Review of Systems  Constitutional:  Positive for activity change. Negative for diaphoresis, fatigue and fever.  Respiratory:  Negative for cough and shortness of breath.   Cardiovascular:  Negative for chest pain.  Gastrointestinal:  Negative for abdominal pain.  Genitourinary:  Positive for flank pain.  Musculoskeletal:  Positive for back pain.  Neurological:  Negative for weakness.  Psychiatric/Behavioral:  Negative for behavioral problems and confusion.    Vital Signs: BP 119/61    Ward (!) 51    Temp 97.6 F (36.4 C) (Oral)    Resp 16    Ht 5\' 4"  (1.626 m)    Wt  214 lb (97.1 kg)    SpO2 96%    BMI 36.73 kg/m   Physical Exam Vitals reviewed.  HENT:     Mouth/Throat:     Mouth: Mucous membranes are moist.  Cardiovascular:     Rate and Rhythm: Normal rate and regular rhythm.     Heart sounds: Normal heart sounds.  Pulmonary:     Effort: Pulmonary effort is normal.     Breath sounds: Normal breath sounds.  Abdominal:     Palpations: Abdomen is soft.  Musculoskeletal:        General: Normal range of motion.  Skin:    General: Skin is warm.  Neurological:     Mental Status: She is alert and oriented to person, place, and time.   Psychiatric:        Behavior: Behavior normal.    Imaging: IR Radiologist Eval & Mgmt  Result Date: 03/08/2021 Please refer to notes tab for details about interventional procedure. (Op Note)   Labs:  CBC: Recent Labs    03/19/21 0640  WBC 6.0  HGB 15.6*  HCT 46.2*  PLT 174    COAGS: Recent Labs    03/19/21 0640  INR 0.9    BMP: No results for input(s): NA, K, CL, CO2, GLUCOSE, BUN, CALCIUM, CREATININE, GFRNONAA, GFRAA in the last 8760 hours.  Invalid input(s): CMP  LIVER FUNCTION TESTS: No results for input(s): BILITOT, AST, ALT, ALKPHOS, PROT, ALBUMIN in the last 8760 hours.  TUMOR MARKERS: No results for input(s): AFPTM, CEA, CA199, CHROMGRNA in the last 8760 hours.  Assessment and Plan:  Polycystic renal disease Scheduled for bilateral renal cyst aspirations Has had several of these procedures in IR-- most recent 1 yr ago; 01/31/20 Risks and benefits of Bilateral renal cyst aspiration was discussed with the patient and/or patient's family including, but not limited to bleeding, infection, damage to adjacent structures.  All of the questions were answered and there is agreement to proceed. Consent signed and in chart.   Thank you for this interesting consult.  I greatly enjoyed meeting Debbie Ward and look forward to participating in their care.  A copy of this report was sent to the requesting provider on this date.  Electronically Signed: Lavonia Drafts, PA-C 03/19/2021, 7:09 AM   I spent a total of    25 Minutes in face to face in clinical consultation, greater than 50% of which was counseling/coordinating care for bilateral renal cyst aspiration

## 2021-03-19 NOTE — Procedures (Signed)
°  Procedure: US aspiration bilat renal cysts 477ml RIGHT and 443ml LEFT removed EBL:   minimal Complications:  none immediate  See full dictation in [facility].  Dillard Cannon MD Main # [phone] Pager  [phone] Mobile [phone]

## 2021-03-21 ENCOUNTER — Ambulatory Visit
Admission: RE | Admit: 2021-03-21 | Discharge: 2021-03-21 | Disposition: A | Discharge status: Home / Self Care | Payer: BC Managed Care – PPO | Source: Ambulatory Visit | Attending: Nephrology | Admitting: Nephrology

## 2021-03-21 DIAGNOSIS — Q613 Polycystic kidney, unspecified: Secondary | ICD-10-CM

## 2021-07-05 LAB — COLOGUARD: COLOGUARD: NEGATIVE

## 2022-06-13 ENCOUNTER — Other Ambulatory Visit (HOSPITAL_COMMUNITY): Payer: Self-pay | Admitting: Nephrology

## 2022-06-13 DIAGNOSIS — Q613 Polycystic kidney, unspecified: Secondary | ICD-10-CM

## 2022-06-14 ENCOUNTER — Encounter: Payer: Self-pay | Admitting: General Practice

## 2022-06-14 NOTE — Progress Notes (Signed)
Debbie Lack, MD  Debbie Ward, NT Done in [place], and with sedation. She had it done on both sides in Feb, 2023. We need to confirm if she is symptomatic on one side, or both sides this time.  GY       Previous Messages    ----- Message ----- From: Debbie Ward, NT Sent: 06/13/2022   3:43 PM EDT To: Ir Procedure Requests Subject: Renal Cyst Aspiration                          Procedure: Renal Cyst Aspiration/US FINE NEEDLE ASP 1ST LESION  Reason: Polycystic Kidney Disease  History: [place] in chart  Provider: Estanislado Emms, MD  Contact: [phone]   Wasn't for sure if this is a IR case or how it needed to be ordered. Please advise Thanks

## 2022-06-18 ENCOUNTER — Encounter: Payer: Self-pay | Admitting: General Practice

## 2022-06-18 NOTE — Progress Notes (Signed)
Irish Lack, MD  Caroleen Hamman, [place]; P Ir Procedure Requests Set up like Feb 2023 procedure. Bilateral renal cyst aspiration under [place] with sedation. Aspiration of largest accessible cysts for symptomatic relief.  GY       Previous Messages    ----- Message ----- From: Caroleen Hamman, NT Sent: 06/18/2022  11:37 AM EDT To: Irish Lack, MD; Ir Procedure Requests Subject: RE: Renal Cyst Aspiration                      MD ofc states pt is having pain on both sides ----- Message ----- From: Irish Lack, MD Sent: 06/14/2022  12:50 PM EDT To: Caroleen Hamman, NT Subject: RE: Renal Cyst Aspiration                      Done in [place], and with sedation. She had it done on both sides in Feb, 2023. We need to confirm if she is symptomatic on one side, or both sides this time.  GY   ----- Message ----- From: Caroleen Hamman, NT Sent: 06/13/2022   3:43 PM EDT To: Ir Procedure Requests Subject: Renal Cyst Aspiration                          Procedure: Renal Cyst Aspiration/US FINE NEEDLE ASP 1ST LESION  Reason: Polycystic Kidney Disease  History: [place] in chart  Provider: Estanislado Emms, MD  Contact: [phone]   Wasn't for sure if this is a IR case or how it needed to be ordered. Please advise Thanks

## 2022-06-23 ENCOUNTER — Encounter (HOSPITAL_COMMUNITY): Payer: Self-pay

## 2022-06-23 ENCOUNTER — Other Ambulatory Visit: Payer: Self-pay | Admitting: Radiology

## 2022-06-23 DIAGNOSIS — Q613 Polycystic kidney, unspecified: Secondary | ICD-10-CM

## 2022-06-23 NOTE — H&P (Signed)
Chief Complaint: Polycystic renal cysts with abdominal pain. Request is for bilateral aspiration  Referring Physician(s): Estanislado Emms  Supervising Physician: Marliss Coots  Patient Status: [facility] - Out-pt  History of Present Illness: Debbie Ward is a 62 y.o. female  62 y.o female outpatient. Known to IR. History of polycystic renal cysts s/p multiple cyst aspirations bilaterally. Last aspiration was on 2.27.23 with 400 ml  of fluid removed on the left and the right. Patient is being followed by [facility]. Now with persistent and worsening lower abdominal pain. Patient presents for bilateral  kidney cyst aspiration. Case approved by IR Attending. Dr. Irish Lack.   Patient alert and laying in bed,calm. Husband at bedside. Endorses abdominal pain and back pain. Denies any fevers, headache, chest pain, SOB, cough, nausea, vomiting or bleeding. Return precautions and treatment recommendations and follow-up discussed with the patient and her husband. Both who are agreeable with the plan.    Past Medical History:  Diagnosis Date   Polycystic renal disease     Past Surgical History:  Procedure Laterality Date   ABDOMINAL HYSTERECTOMY     partial   APPENDECTOMY     CHOLECYSTECTOMY     IR RADIOLOGIST EVAL & MGMT  03/08/2021    Allergies: Percocet [oxycodone-acetaminophen]  Medications: Prior to Admission medications   Medication Sig Start Date End Date Taking? Authorizing Provider  acetaminophen (TYLENOL) 650 MG CR tablet Take 1,300 mg by mouth daily as needed for pain.    [provider]  ibuprofen (ADVIL,MOTRIN) 200 MG tablet Take 400 mg by mouth daily as needed for mild pain or moderate pain.     [provider]  lisinopril-hydrochlorothiazide (PRINZIDE,ZESTORETIC) 20-12.5 MG tablet Take 1 tablet by mouth daily. 03/17/17   [provider]     Family History  Problem Relation Age of Onset   Polycystic kidney disease Mother    Cancer -  Lung Father     Social History   Socioeconomic History   Marital status: Married    Spouse name: Not on file   Number of children: Not on file   Years of education: Not on file   Highest education level: Not on file  Occupational History   Not on file  Tobacco Use   Smoking status: Never   Smokeless tobacco: Never  Vaping Use   Vaping Use: Never used  Substance and Sexual Activity   Alcohol use: No   Drug use: No   Sexual activity: Not on file  Other Topics Concern   Not on file  Social History Narrative   Not on file   Social Determinants of Health   Financial Resource Strain: Not on file  Food Insecurity: Not on file  Transportation Needs: Not on file  Physical Activity: Not on file  Stress: Not on file  Social Connections: Not on file    Review of Systems: A 12 point ROS discussed and pertinent positives are indicated in the HPI above.  All other systems are negative.  Review of Systems  Constitutional:  Negative for fatigue and fever.  HENT:  Negative for congestion.   Respiratory:  Negative for cough and shortness of breath.   Gastrointestinal:  Positive for abdominal pain. Negative for diarrhea, nausea and vomiting.  Musculoskeletal:  Positive for back pain.    Vital Signs: BP (!) 142/69   Pulse (!) 52   Temp 98.2 F (36.8 C) (Temporal)   Resp 18   Ht 5\' 4"  (1.626 m)  Wt 205 lb (93 kg)   SpO2 96%   BMI 35.19 kg/m     Physical Exam Vitals and nursing note reviewed.  Constitutional:      Appearance: She is well-developed.  HENT:     Head: Normocephalic and atraumatic.  Eyes:     Conjunctiva/sclera: Conjunctivae normal.  Cardiovascular:     Rate and Rhythm: Normal rate and regular rhythm.     Heart sounds: Normal heart sounds.  Pulmonary:     Effort: Pulmonary effort is normal.     Breath sounds: Normal breath sounds.  Musculoskeletal:        General: Normal range of motion.     Cervical back: Normal range of motion.  Skin:     General: Skin is warm and dry.  Neurological:     General: No focal deficit present.     Mental Status: She is alert and oriented to person, place, and time.  Psychiatric:        Mood and Affect: Mood normal.        Behavior: Behavior normal.        Thought Content: Thought content normal.        Judgment: Judgment normal.     Imaging: No results found.  Labs:  CBC: Recent Labs    06/24/22 0709  WBC 3.8*  HGB 13.6  HCT 40.0  PLT 120*    COAGS: Recent Labs    06/24/22 0709  INR 1.0    BMP: No results for input(s): "NA", "K", "CL", "CO2", "GLUCOSE", "BUN", "CALCIUM", "CREATININE", "GFRNONAA", "GFRAA" in the last 8760 hours.  Invalid input(s): "CMP"  LIVER FUNCTION TESTS: No results for input(s): "BILITOT", "AST", "ALT", "ALKPHOS", "PROT", "ALBUMIN" in the last 8760 hours.  TUMOR MARKERS: No results for input(s): "AFPTM", "CEA", "CA199", "CHROMGRNA" in the last 8760 hours.  Assessment and Plan:  62 y.o female outpatient. Known to IR. History of polycystic renal cysts s/p multiple cyst aspirations bilaterally. Last aspiration was on 2.27.23 with 400 ml  of fluid removed on the left and the right. Patient is being followed by [facility]. Now with persistent and worsening lower abdominal pain. Patient presents for bilateral  kidney cyst aspiration. Case approved by IR Attending. Dr. Irish Lack.   No recent labs. All medications are within acceptable parameters. Allergies include percocet. Patient has been NPO since midnight.   Risks and benefits of bilateral renal cyst aspiration.  was discussed with the patient and/or patient's family including, but not limited to bleeding, infection, damage to adjacent structures or low yield requiring additional tests.  All of the questions were answered and there is agreement to proceed.  Consent signed and in chart.   Thank you for this interesting consult.  I greatly enjoyed meeting Debbie Ward and look forward  to participating in their care.  A copy of this report was sent to the requesting provider on this date.  Electronically Signed: Alene Mires, NP 06/24/2022, 7:44 AM   I spent a total of  30 Minutes   in face to face in clinical consultation, greater than 50% of which was counseling/coordinating care for bilateral renal cysts.

## 2022-06-24 ENCOUNTER — Other Ambulatory Visit (HOSPITAL_COMMUNITY): Payer: Self-pay | Admitting: Nephrology

## 2022-06-24 ENCOUNTER — Ambulatory Visit (HOSPITAL_COMMUNITY)
Admission: RE | Admit: 2022-06-24 | Discharge: 2022-06-24 | Disposition: A | Discharge status: Home / Self Care | Payer: BC Managed Care – PPO | Source: Ambulatory Visit | Attending: Nephrology | Admitting: Nephrology

## 2022-06-24 VITALS — BP 116/61 | HR 61 | Temp 98.2°F | Resp 16 | Ht 64.0 in | Wt 205.0 lb

## 2022-06-24 DIAGNOSIS — Q613 Polycystic kidney, unspecified: Secondary | ICD-10-CM

## 2022-06-24 DIAGNOSIS — Q6102 Congenital multiple renal cysts: Secondary | ICD-10-CM

## 2022-06-24 DIAGNOSIS — Q612 Polycystic kidney, adult type: Secondary | ICD-10-CM | POA: Insufficient documentation

## 2022-06-24 LAB — CBC
HCT: 40 % (ref 36.0–46.0)
Hemoglobin: 13.6 g/dL (ref 12.0–15.0)
MCH: 30.8 pg (ref 26.0–34.0)
MCHC: 34 g/dL (ref 30.0–36.0)
MCV: 90.5 fL (ref 80.0–100.0)
Platelets: 120 10*3/uL — ABNORMAL LOW (ref 150–400)
RBC: 4.42 MIL/uL (ref 3.87–5.11)
RDW: 12.6 % (ref 11.5–15.5)
WBC: 3.8 10*3/uL — ABNORMAL LOW (ref 4.0–10.5)
nRBC: 0 % (ref 0.0–0.2)

## 2022-06-24 LAB — PROTIME-INR
INR: 1 (ref 0.8–1.2)
Prothrombin Time: 13.1 seconds (ref 11.4–15.2)

## 2022-06-24 MED ORDER — MIDAZOLAM HCL 2 MG/2ML IJ SOLN
INTRAMUSCULAR | Status: AC
  Filled 2022-06-24: qty 2

## 2022-06-24 MED ORDER — MIDAZOLAM HCL 2 MG/2ML IJ SOLN
INTRAMUSCULAR | Status: AC | PRN
  Administered 2022-06-24: 1 mg via INTRAVENOUS
  Administered 2022-06-24: .5 mg via INTRAVENOUS
  Administered 2022-06-24 (×3): 1 mg via INTRAVENOUS

## 2022-06-24 MED ORDER — FENTANYL CITRATE (PF) 100 MCG/2ML IJ SOLN
INTRAMUSCULAR | Status: AC | PRN
Start: 2022-06-24 — End: 2022-06-24
  Administered 2022-06-24 (×4): 50 ug via INTRAVENOUS

## 2022-06-24 MED ORDER — SODIUM CHLORIDE 0.9 % IV SOLN: INTRAVENOUS | Status: DC

## 2022-06-24 MED ORDER — LIDOCAINE-EPINEPHRINE 1 %-1:100000 IJ SOLN
INTRAMUSCULAR | Status: AC
  Filled 2022-06-24: qty 1

## 2022-06-24 MED ORDER — FENTANYL CITRATE (PF) 100 MCG/2ML IJ SOLN
INTRAMUSCULAR | Status: AC
  Filled 2022-06-24: qty 2

## 2022-06-24 MED ORDER — LIDOCAINE-EPINEPHRINE 1 %-1:100000 IJ SOLN
20.0000 mL | Freq: Once | INTRAMUSCULAR | Status: AC
  Administered 2022-06-24: 20 mL via INTRADERMAL

## 2022-06-24 NOTE — Sedation Documentation (Signed)
 cystic fluid removed from the right side

## 2022-06-24 NOTE — Procedures (Signed)
Pre Procedure Dx: ADPCKD; Recurrent symptomatic renal cysts. Post Procedural Dx: Same  Technically successful US guided aspiration of multiple bilateral renal cysts yielding a total of 1.5 L of serous fluid from the left kidney and 850 cc of serous fluid from the right kidney.   EBL: None No immediate complications.   Katherina Right, MD Pager #: [phone]

## 2022-06-24 NOTE — Sedation Documentation (Addendum)
1.5L cystic fluid removed from the left side.

## 2022-06-26 ENCOUNTER — Other Ambulatory Visit: Payer: Self-pay | Admitting: Interventional Radiology

## 2022-06-26 DIAGNOSIS — N281 Cyst of kidney, acquired: Secondary | ICD-10-CM

## 2022-07-15 ENCOUNTER — Ambulatory Visit
Admission: RE | Admit: 2022-07-15 | Discharge: 2022-07-15 | Disposition: A | Discharge status: Home / Self Care | Payer: BC Managed Care – PPO | Source: Ambulatory Visit | Attending: Interventional Radiology | Admitting: Interventional Radiology

## 2022-07-15 DIAGNOSIS — N281 Cyst of kidney, acquired: Secondary | ICD-10-CM

## 2022-07-15 NOTE — Progress Notes (Signed)
Patient ID: Debbie Ward, female   DOB: 09/18/1960, 62 y.o.   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 female with past medical history significant for autosomal dominant polycystic renal disease who underwent technically successful ultrasound-guided aspiration of multiple bilateral renal cysts, ultimately yielding 1.5 L of serous fluid from left-sided renal cysts and 850 cc of serous fluid from multiple right-sided renal cysts.  She is seen today in postprocedural evaluation and management.    The patient has undergone multiple previous image guided renal cyst aspirations, most recently on 03/19/2021 yielding a total of 400 cc from right-sided renal cysts and 450 cc of left-sided renal cysts.  The patient reports significant improvement in her preprocedural back pain and abdominal fullness.  He is now able to participate with yoga activities and is very pleased with the outcome from the intervention.    Past Medical History:  Diagnosis Date   Polycystic renal disease     Past Surgical History:  Procedure Laterality Date   ABDOMINAL HYSTERECTOMY     partial   APPENDECTOMY     CHOLECYSTECTOMY     IR RADIOLOGIST EVAL & MGMT  03/08/2021    Allergies: Percocet [oxycodone-acetaminophen]  Medications: Prior to Admission medications   Medication Sig Start Date End Date Taking? Authorizing Provider  acetaminophen (TYLENOL) 650 MG CR tablet Take 1,300 mg by mouth daily as needed for pain.    [provider]  amLODipine (NORVASC) 5 MG tablet Take 5 mg by mouth daily.    [provider]  ibuprofen (ADVIL,MOTRIN) 200 MG tablet Take 400 mg by mouth daily as needed for mild pain or moderate pain.     [provider]  lisinopril-hydrochlorothiazide (PRINZIDE,ZESTORETIC) 20-12.5  MG tablet Take 1 tablet by mouth daily. 03/17/17   [provider]  ondansetron (ZOFRAN-ODT) 4 MG disintegrating tablet Take 4 mg by mouth every 8 (eight) hours as needed for nausea or vomiting.    [provider]     Family History  Problem Relation Age of Onset   Polycystic kidney disease Mother    Cancer - Lung Father     Social History   Socioeconomic History   Marital status: Married    Spouse name: Not on file   Number of children: Not on file   Years of education: Not on file   Highest education level: Not on file  Occupational History   Not on file  Tobacco Use   Smoking status: Never   Smokeless tobacco: Never  Vaping Use   Vaping Use: Never used  Substance and Sexual Activity   Alcohol use: No   Drug use: No   Sexual activity: Not on file  Other Topics Concern   Not on file  Social History Narrative   Not on file   Social Determinants of Health   Financial Resource Strain: Not on file  Food Insecurity: Not on file  Transportation Needs: Not on file  Physical Activity: Not on file  Stress: Not on file  Social Connections: Not on file    ECOG Status: 1 - Symptomatic but completely ambulatory  Review of Systems  Review of Systems: A 12 point ROS discussed and pertinent positives are indicated in the HPI above.  All other systems are negative.    Physical Exam No  direct physical exam was performed (except for noted visual exam findings with Video Visits).   Vital Signs: There were no vitals taken for this visit.  Imaging:  Following examinations were reviewed in detail: Intraprocedural imaging during bilateral renal cysts aspirations performed 06/24/2022 and 03/19/2021 CT abdomen and pelvis-10/20/2014  [place] FINE NEEDLE ASP 1ST LESION  Result Date: 06/24/2022 INDICATION: History of autosomal dominant polycystic kidney disease, now with recurrent symptomatic renal cysts. Patient presents today for image guided aspiration of multiple  symptomatic bilateral renal cysts, left-greater-than-right. EXAM: 1. ULTRASOUND-GUIDED ASPIRATION OF MULTIPLE RIGHT-SIDED RENAL CYSTS 2. ULTRASOUND-GUIDED ASPIRATION OF MULTIPLE LEFT-SIDED RENAL CYSTS. COMPARISON:  Multiple previous ultrasound-guided renal cyst aspirations, most recently on 03/19/2021 yielding 400 cc from the right kidney and 450 cc from the left kidney. MEDICATIONS: None ANESTHESIA/SEDATION: Moderate (conscious) sedation was employed during this procedure as administered by the Interventional Radiology RN. A total of Versed 4.25 mg and Fentanyl 200 mcg was administered intravenously. Moderate Sedation Time: 80 minutes. The patient's level of consciousness and vital signs were monitored continuously by radiology nursing throughout the procedure under my direct supervision. CONTRAST:  None COMPLICATIONS: None immediate. PROCEDURE: Informed written consent was obtained from the patient after a discussion of the risks, benefits and alternatives to treatment. The patient was placed prone on her hospital gantry and ultrasound scanning was performed of the bilateral kidneys. The procedure was planned. A timeout was performed prior to the initiation of the procedure. The skin overlying the bilateral flanks were prepped and draped in the usual sterile fashion. The overlying soft tissues were anesthetized with 1% lidocaine with epinephrine. Under direct ultrasound guidance, multiple left-sided renal cysts were targeted with an 18 gauge trocar needle with attempt made to traversing multiple cysts with a single pass, ultimately leading to the aspiration of approximately 1.5 cc of serous fluid from multiple left-sided renal cysts. The identical procedure was performed on the right-sided kidney, ultimately yielding 850 cc of serous fluid from multiple right-sided renal cysts. Postprocedural imaging was obtained and the procedure was terminated. Dressings were applied. The patient tolerated the above procedure  well without immediate postprocedural complication. IMPRESSION: 1. Technically successful ultrasound-guided aspiration of multiple left-sided renal cysts yielding approximately 1.5 cc of serous fluid. 2. Technically successful ultrasound-guided aspiration of multiple right-sided renal cysts yielding approximately 850 cc of serous fluid. PLAN: The patient will be seen in follow-up telemedicine consultation in approximately 3-4 weeks to ensure symptomatic relief. Ultimately, the patient will be seen in consultation prior to her next ultrasound-guided aspiration for consideration of sclerotherapy in addition to renal cyst aspiration. Electronically Signed   By: Simonne Come M.D.   On: 06/24/2022 12:56   [place] FINE NEEDLE ASP EA ADDL LESION  Result Date: 06/24/2022 INDICATION: History of autosomal dominant polycystic kidney disease, now with recurrent symptomatic renal cysts. Patient presents today for image guided aspiration of multiple symptomatic bilateral renal cysts, left-greater-than-right. EXAM: 1. ULTRASOUND-GUIDED ASPIRATION OF MULTIPLE RIGHT-SIDED RENAL CYSTS 2. ULTRASOUND-GUIDED ASPIRATION OF MULTIPLE LEFT-SIDED RENAL CYSTS. COMPARISON:  Multiple previous ultrasound-guided renal cyst aspirations, most recently on 03/19/2021 yielding 400 cc from the right kidney and 450 cc from the left kidney. MEDICATIONS: None ANESTHESIA/SEDATION: Moderate (conscious) sedation was employed during this procedure as administered by the Interventional Radiology RN. A total of Versed 4.25 mg and Fentanyl 200 mcg was administered intravenously. Moderate Sedation Time: 80 minutes. The patient's level of consciousness and vital signs were monitored continuously by radiology nursing throughout the procedure under my direct supervision. CONTRAST:  None COMPLICATIONS:  None immediate. PROCEDURE: Informed written consent was obtained from the patient after a discussion of the risks, benefits and alternatives to treatment. The patient was  placed prone on her hospital gantry and ultrasound scanning was performed of the bilateral kidneys. The procedure was planned. A timeout was performed prior to the initiation of the procedure. The skin overlying the bilateral flanks were prepped and draped in the usual sterile fashion. The overlying soft tissues were anesthetized with 1% lidocaine with epinephrine. Under direct ultrasound guidance, multiple left-sided renal cysts were targeted with an 18 gauge trocar needle with attempt made to traversing multiple cysts with a single pass, ultimately leading to the aspiration of approximately 1.5 cc of serous fluid from multiple left-sided renal cysts. The identical procedure was performed on the right-sided kidney, ultimately yielding 850 cc of serous fluid from multiple right-sided renal cysts. Postprocedural imaging was obtained and the procedure was terminated. Dressings were applied. The patient tolerated the above procedure well without immediate postprocedural complication. IMPRESSION: 1. Technically successful ultrasound-guided aspiration of multiple left-sided renal cysts yielding approximately 1.5 cc of serous fluid. 2. Technically successful ultrasound-guided aspiration of multiple right-sided renal cysts yielding approximately 850 cc of serous fluid. PLAN: The patient will be seen in follow-up telemedicine consultation in approximately 3-4 weeks to ensure symptomatic relief. Ultimately, the patient will be seen in consultation prior to her next ultrasound-guided aspiration for consideration of sclerotherapy in addition to renal cyst aspiration. Electronically Signed   By: Simonne Come M.D.   On: 06/24/2022 12:56    Labs:  CBC: Recent Labs    06/24/22 0709  WBC 3.8*  HGB 13.6  HCT 40.0  PLT 120*    COAGS: Recent Labs    06/24/22 0709  INR 1.0    BMP: No results for input(s): "NA", "K", "CL", "CO2", "GLUCOSE", "BUN", "CALCIUM", "CREATININE", "GFRNONAA", "GFRAA" in the last 8760  hours.  Invalid input(s): "CMP"  LIVER FUNCTION TESTS: No results for input(s): "BILITOT", "AST", "ALT", "ALKPHOS", "PROT", "ALBUMIN" in the last 8760 hours.  TUMOR MARKERS: No results for input(s): "AFPTM", "CEA", "CA199", "CHROMGRNA" in the last 8760 hours.  Assessment and Plan:  Debbie Ward is a 62 y.o. female with past medical history significant for autosomal dominant polycystic renal disease who underwent technically successful ultrasound-guided aspiration of multiple bilateral renal cysts, ultimately yielding 1.5 L of serous fluid from left-sided renal cysts and 850 cc of serous fluid from multiple right-sided renal cysts.  She reports significant improvement in her preprocedural back pain and abdominal fullness.  He is now able to participate with yoga activities and is very pleased with the outcome from the intervention.  Following examinations were reviewed in detail: Intraprocedural imaging during bilateral renal cysts aspirations performed 06/24/2022 and 03/19/2021 CT abdomen and pelvis-10/20/2014  I explained to the patient that her most recent intervention yielded 3-4 times the amount of fluid aspirated during her previous interventions and as such I am hopeful she will have a more significant improvement in her cyst related discomfort as well as a more durable result.  Ultimately, once the patient's symptomatic cysts returned, I emphasized the importance of the patient reaching out to the interventional radiology clinic for evaluation and management.  Once that occurs, I would recommend obtaining a noncontrast CT scan of the abdomen pelvis as she has not had an abdominal CT in our system since 09/2014 to better evaluate her bilateral renal cyst burden.  Pending the results of this CT scan, the patient may be a  candidate for image guided sclerotherapy of several of her largest renal cysts pending approval from her nephrologist, Dr. Malen Gauze.  Preliminary conversations were held with  the patient regarding renal cyst sclerotherapy including the complexity of this intervention, potentially necessitating drainage catheter placement under CT guidance followed by transfer to IR for fluoroscopic guided injection to confirm the cysts lack of communication with the renal collecting system.  I explained that this procedure could result and more significant postprocedural discomfort that would be performed in hopes of reducing her most symptomatic and largest renal cysts.  The patient demonstrated excellent understanding of this conversation and is in agreement with the plan of care.    She knows to call the interventional radiology clinic when she is beginning to experience recurrence renal cyst symptomatology.  Plan: - The patient will call the interventional radiology clinic for preprocedural evaluation and management when she develops recurrent renal cyst symptomatology.  A copy of this report was sent to the requesting provider on this date.  Electronically Signed: Simonne Come 07/15/2022, 9:44 AM   I spent a total of 15 Minutes in remote  clinical consultation, greater than 50% of which was counseling/coordinating care for percutaneous management of recurrent symptomatic bilateral renal cysts.    Visit type: Audio only (telephone). Audio (no video) only due to patient's lack of internet/smartphone capability. Alternative for in-person consultation at [facility], [address] [place], [place], [place]. This visit type was conducted due to national recommendations for restrictions regarding the COVID-19 Pandemic (e.g. social distancing).  This format is felt to be most appropriate for this patient at this time.  All issues noted in this document were discussed and addressed.

## 2022-11-12 ENCOUNTER — Ambulatory Visit (INDEPENDENT_AMBULATORY_CARE_PROVIDER_SITE_OTHER): Payer: BC Managed Care – PPO | Admitting: Podiatry

## 2022-11-12 DIAGNOSIS — Z91199 Patient's noncompliance with other medical treatment and regimen due to unspecified reason: Secondary | ICD-10-CM

## 2022-11-12 NOTE — Progress Notes (Signed)
No show

## 2022-11-26 ENCOUNTER — Other Ambulatory Visit: Payer: Self-pay | Admitting: Nephrology

## 2022-11-26 DIAGNOSIS — N281 Cyst of kidney, acquired: Secondary | ICD-10-CM

## 2022-11-29 ENCOUNTER — Ambulatory Visit
Admission: RE | Admit: 2022-11-29 | Discharge: 2022-11-29 | Disposition: A | Discharge status: Home / Self Care | Payer: BC Managed Care – PPO | Source: Ambulatory Visit | Attending: Nephrology

## 2022-11-29 DIAGNOSIS — N281 Cyst of kidney, acquired: Secondary | ICD-10-CM

## 2022-12-11 ENCOUNTER — Other Ambulatory Visit (HOSPITAL_COMMUNITY): Payer: Self-pay | Admitting: Nephrology

## 2022-12-11 DIAGNOSIS — I517 Cardiomegaly: Secondary | ICD-10-CM

## 2022-12-12 ENCOUNTER — Ambulatory Visit (HOSPITAL_COMMUNITY)
Admission: RE | Admit: 2022-12-12 | Discharge: 2022-12-12 | Disposition: A | Discharge status: Home / Self Care | Payer: BC Managed Care – PPO | Source: Ambulatory Visit | Attending: Nephrology | Admitting: Nephrology

## 2022-12-12 DIAGNOSIS — I517 Cardiomegaly: Secondary | ICD-10-CM | POA: Diagnosis present

## 2022-12-12 LAB — ECHOCARDIOGRAM COMPLETE
AR max vel: 2.77 cm2
AV Area VTI: 2.76 cm2
AV Area mean vel: 2.73 cm2
AV Mean grad: 3 mm[Hg]
AV Peak grad: 5.7 mm[Hg]
Ao pk vel: 1.19 m/s
Area-P 1/2: 3.21 cm2
S' Lateral: 3.2 cm

## 2022-12-12 NOTE — Progress Notes (Signed)
  Echocardiogram 2D Echocardiogram has been performed.  Debbie Ward 12/12/2022, 3:42 PM

## 2022-12-16 ENCOUNTER — Other Ambulatory Visit: Payer: Self-pay

## 2022-12-16 DIAGNOSIS — E785 Hyperlipidemia, unspecified: Secondary | ICD-10-CM | POA: Insufficient documentation

## 2022-12-16 DIAGNOSIS — R0600 Dyspnea, unspecified: Secondary | ICD-10-CM | POA: Insufficient documentation

## 2022-12-16 DIAGNOSIS — D329 Benign neoplasm of meninges, unspecified: Secondary | ICD-10-CM | POA: Insufficient documentation

## 2022-12-16 DIAGNOSIS — I129 Hypertensive chronic kidney disease with stage 1 through stage 4 chronic kidney disease, or unspecified chronic kidney disease: Secondary | ICD-10-CM | POA: Insufficient documentation

## 2022-12-16 DIAGNOSIS — Q613 Polycystic kidney, unspecified: Secondary | ICD-10-CM | POA: Insufficient documentation

## 2022-12-16 DIAGNOSIS — N2581 Secondary hyperparathyroidism of renal origin: Secondary | ICD-10-CM | POA: Insufficient documentation

## 2022-12-16 DIAGNOSIS — R110 Nausea: Secondary | ICD-10-CM | POA: Insufficient documentation

## 2022-12-16 DIAGNOSIS — D631 Anemia in chronic kidney disease: Secondary | ICD-10-CM | POA: Insufficient documentation

## 2022-12-16 DIAGNOSIS — I517 Cardiomegaly: Secondary | ICD-10-CM | POA: Insufficient documentation

## 2022-12-16 DIAGNOSIS — N184 Chronic kidney disease, stage 4 (severe): Secondary | ICD-10-CM | POA: Insufficient documentation

## 2022-12-16 DIAGNOSIS — N1832 Chronic kidney disease, stage 3b: Secondary | ICD-10-CM | POA: Insufficient documentation

## 2022-12-17 ENCOUNTER — Ambulatory Visit: Payer: BC Managed Care – PPO

## 2022-12-17 VITALS — BP 114/73 | HR 64 | Ht 64.0 in | Wt 209.0 lb

## 2022-12-17 DIAGNOSIS — I129 Hypertensive chronic kidney disease with stage 1 through stage 4 chronic kidney disease, or unspecified chronic kidney disease: Secondary | ICD-10-CM

## 2022-12-17 NOTE — Assessment & Plan Note (Addendum)
Well-controlled at this time. Target below 120 over 80 mmHg. She is tolerating her current medication of amlodipine 5 mg once daily and lisinopril 40 mg once daily. Continue with the current doses.  No evidence of cardiomegaly as reviewed on echocardiogram images. No further cardiac workup required at this time.  If in future she needs ischemic evaluation or coronary artery disease in the setting of her anticipated transplant needs, will get assess with cardiac PET stress or coronary angiogram by invasive or noninvasive methods depending on her renal status [further on dialysis or not].  Encouraged on follow-up closely with her nephrologist and primary care to also be screened for any involvement of intracerebral aneurysms given her history of polycystic kidney disease.

## 2022-12-17 NOTE — Progress Notes (Signed)
Cardiology Consultation:    Date:  12/17/2022   ID:  Debbie Ward, Debbie Ward January 14, 1961, MRN [id number]  PCP:  Debbie Mo, NP  Cardiologist:  Debbie Ward Debbie Mossbarger, MD   Referring MD: Debbie Mo, NP   No chief complaint on file.    ASSESSMENT AND PLAN:   Mr Debbie Ward 62/F with history of an abdominal polycystic renal disease, CKD stage IIIb/IV, hypertension, meningioma, hyperlipidemia, secondary hyperparathyroidism, here for follow-up regarding findings of enlarged heart noted on CT abdomen pelvis recently 11/29/2022.  Echocardiogram done since then 12/12/2022 noted normal LVEF 55 to 60% with normal LV size and wall motion.  Problem List Items Addressed This Visit     Hypertension, renal disease - Primary    Well-controlled at this time. Target below 120 over 80 mmHg. She is tolerating her current medication of amlodipine 5 mg once daily and lisinopril 40 mg once daily. Continue with the current doses.  No evidence of cardiomegaly as reviewed on echocardiogram images. No further cardiac workup required at this time.  If in future she needs ischemic evaluation or coronary artery disease in the setting of her anticipated transplant needs, will get assess with cardiac PET stress or coronary angiogram by invasive or noninvasive methods depending on her renal status [further on dialysis or not].  Encouraged on follow-up closely with her nephrologist and primary care to also be screened for any involvement of intracerebral aneurysms given her history of polycystic kidney disease.      Relevant Orders   EKG 12-Lead (Completed)  Follow-up with [place] on an as-needed basis.  History of Present Illness:    Debbie Ward is a 63 y.o. female who is being seen today for the evaluation of cardiomegaly reported on recent CT abdomen pelvis from 11/29/2022 at the request of Debbie Mo, NP.  Follows up with nephrologist at [facility]  Dr. Billey Ward visit 11/25/2022]  Has  history of autosomal dominant polycystic kidney disease, currently CKD stage IIIb/4 and in the process of getting on transplant list, hypertension, meningioma, hyperlipidemia, secondary hyperparathyroidism.  Here for the visit by herself. Had routine workup done from polycystic kidney disease standpoint with CT abdomen pelvis recently on 11/29/2022 that noted radiologist reporting heart to be enlarged on lower chest images and here for further evaluation.  She tells me about her polycystic kidney disease diagnosis in her early 3s and since then has been following up closely with nephrologist and undergoes IR guided drainage of the cyst from time to time and currently being considered for drainage again along with sclerosis.  She denies any significant prior cardiac history other than high blood pressure.  Does report symptoms associated with elevated blood pressures at home sometimes with headache and awareness of heartbeat.  Rare short instances of fluttering in the chest not associated with any other symptoms.  Denies any chest pain, exertional symptoms such as angina, shortness of breath.  Denies any syncopal episodes.  Denies any pedal edema.  Denies any blood in urine or stools.  Good compliance with her medications amlodipine 5 mg once daily Lisinopril recently increased to 40 mg once daily. She has been started on tolvaptan with the intent to slow down the progression of her PCKD.  She uses this and gives it a break intermittently as the effect of excess diuresis especially at night interferes with her quality of life.  EKG in the clinic today shows sinus rhythm heart rate 64/min, PR interval 136 ms, QRS duration 84 ms  with normal axis.  Echocardiogram to review from 12/12/2022 reported normal LVEF 55 to 60%, grade 1 dysfunction, normal RV function and size.  No significant valve dysfunction.  Reviewed images myself interventricular septum and left ventricular posterior wall thickness in  diastole measuring 1.1 cm.  Suggestive of mild concentric left ventricular hypertrophy.  Left ventricular intrinsic diameter in diastole 4.4 cm on parasternal long axis views.  Septal E prime velocity 6cm/s, lateral prognostically 11 cm/s with normal left atrial size, even with E/A ratio less than 1, suggest normal diastolic function and global longitudinal strain was noted as -19.9%.  There was mild tricuspid regurgitation.  Normal RVSP.  No pericardial effusion.  Overall reassuring findings on the echocardiogram without any evidence of cardiomegaly.  Previous stress was available to review is from October 2019 with Lexiscan mild perfusion stress test showing normal findings  Does not smoke.  Does not drink alcohol.  Has family history of polycystic kidney disease with her mother going on dialysis and eventually passing away in her 6s. One of her sister has polycystic kidney disease and follows up closely with nephrologist. Her children have been screened other than 1 youngest daughter in her 41s.  Last blood work available to review from her nephrologist documentation noted November 15, 2022 creatinine 1.86, EGFR 31, potassium 4.7, sodium 142 Hemoglobin 14.5 Normal transaminases and alkaline phosphatase I  Past Medical History:  Diagnosis Date   Anemia secondary to renal failure    Cardiomegaly    CKD (chronic kidney disease), stage IV (HCC)    Depression 12/01/2013   Diastasis recti 05/01/2016   Dyspareunia due to non-psychogenic cause in female 09/19/2018   Dyspnea    Heartburn 05/01/2016   Hyperlipemia    Hypertension, renal disease    Meningioma (HCC)    Multiple renal cysts    Nausea    PCK (polycystic kidney disease)    Polycystic renal disease    Secondary hyperparathyroidism (HCC)    Seizure (HCC) 04/20/2014   Stage 3b chronic kidney disease (CKD) (HCC)    Transient alteration of awareness 12/01/2013    Past Surgical History:  Procedure Laterality Date   ABDOMINAL  HYSTERECTOMY     partial   APPENDECTOMY     CHOLECYSTECTOMY     IR RADIOLOGIST EVAL & MGMT  03/08/2021    Current Medications: Current Meds  Medication Sig   acetaminophen (TYLENOL) 325 MG tablet Take 650 mg by mouth as needed for mild pain (pain score 1-3) or moderate pain (pain score 4-6).   amLODipine (NORVASC) 5 MG tablet Take 5 mg by mouth daily.   lisinopril (ZESTRIL) 40 MG tablet Take 40 mg by mouth daily.   ondansetron (ZOFRAN-ODT) 4 MG disintegrating tablet Take 4 mg by mouth every 8 (eight) hours as needed for nausea or vomiting.     Allergies:   Percocet [oxycodone-acetaminophen]   Social History   Socioeconomic History   Marital status: Married    Spouse name: Not on file   Number of children: Not on file   Years of education: Not on file   Highest education level: Not on file  Occupational History   Not on file  Tobacco Use   Smoking status: Never   Smokeless tobacco: Never  Vaping Use   Vaping status: Never Used  Substance and Sexual Activity   Alcohol use: No   Drug use: No   Sexual activity: Not on file  Other Topics Concern   Not on file  Social History Narrative  Not on file   Social Determinants of Health   Financial Resource Strain: Low Risk  (08/13/2022)   Received from [facility]   Overall Financial Resource Strain (CARDIA)    Difficulty of Paying Living Expenses: Not very hard  Food Insecurity: No Food Insecurity (08/13/2022)   Received from [facility]   Hunger Vital Sign    Worried About Running Out of Food in the Last Year: Never true    Ran Out of Food in the Last Year: Never true  Transportation Needs: No Transportation Needs (08/13/2022)   Received from [facility] - Transportation    Lack of Transportation (Medical): No    Lack of Transportation (Non-Medical): No  Physical Activity: Insufficiently Active (08/13/2022)   Received from [facility]   Exercise Vital Sign    Days of Exercise per Week: 2 days     Minutes of Exercise per Session: 30 min  Stress: No Stress Concern Present (08/13/2022)   Received from [facility] - Occupational Stress Questionnaire    Feeling of Stress : Only a little  Social Connections: Socially Integrated (08/13/2022)   Received from [facility]   Social Network    How would you rate your social network (family, work, friends)?: Good participation with social networks     Family History: The patient's family history includes Cancer - Lung in her father; Polycystic kidney disease in her mother. ROS:   Please see the history of present illness.    All 14 point review of systems negative except as described per history of present illness.  EKGs/Labs/Other Studies Reviewed:    The following studies were reviewed today:   EKG:  EKG Interpretation Date/Time:  Tuesday December 17 2022 09:50:11 EST Ventricular Rate:  64 PR Interval:  136 QRS Duration:  84 QT Interval:  406 QTC Calculation: 418 R Axis:   -2  Text Interpretation: Normal sinus rhythm Normal ECG No previous ECGs available Confirmed by Huntley Dec reddy [phone]) on 12/17/2022 9:53:14 AM    Recent Labs: 06/24/2022: Hemoglobin 13.6; Platelets 120  Recent Lipid Panel No results found for: "CHOL", "TRIG", "HDL", "CHOLHDL", "VLDL", "LDLCALC", "LDLDIRECT"  Physical Exam:    VS:  BP 114/73 (BP Location: Left Arm, Patient Position: Sitting, Cuff Size: Normal)   Pulse 64   Ht 5\' 4"  (1.626 m)   Wt 209 lb (94.8 kg)   SpO2 97%   BMI 35.87 kg/m     Wt Readings from Last 3 Encounters:  12/17/22 209 lb (94.8 kg)  06/24/22 205 lb (93 kg)  03/19/21 214 lb (97.1 kg)     GENERAL:  Well nourished, well developed in no acute distress NECK: No JVD; No carotid bruits CARDIAC: RRR, S1 and S2 present, no murmurs, no rubs, no gallops CHEST:  Clear to auscultation without rales, wheezing or rhonchi  Extremities: No pitting pedal edema. Pulses bilaterally  symmetric with radial 2+ and dorsalis pedis 2+ NEUROLOGIC:  Alert and oriented x 3  Medication Adjustments/Labs and Tests Ordered: Current medicines are reviewed at length with the patient today.  Concerns regarding medicines are outlined above.  Orders Placed This Encounter  Procedures   EKG 12-Lead   No orders of the defined types were placed in this encounter.   Signed, Cecille Amsterdam, MD, MPH, [facility]. 12/17/2022 10:27 AM    [facility]

## 2022-12-17 NOTE — Patient Instructions (Signed)
Medication Instructions:  Your physician recommends that you continue on your current medications as directed. Please refer to the Current Medication list given to you today.  *If you need a refill on your cardiac medications before your next appointment, please call your pharmacy*   Lab Work: None ordered     Testing/Procedures:  None ordered  Follow-Up: At [facility], you and your health needs are our priority.  As part of our continuing mission to provide you with exceptional heart care, we have created designated Provider Care Teams.  These Care Teams include your primary Cardiologist (physician) and Advanced Practice Providers (APPs -  Physician Assistants and Nurse Practitioners) who all work together to provide you with the care you need, when you need it.  We recommend signing up for the patient portal called "MyChart".  Sign up information is provided on this After Visit Summary.  MyChart is used to connect with patients for Virtual Visits (Telemedicine).  Patients are able to view lab/test results, encounter notes, upcoming appointments, etc.  Non-urgent messages can be sent to your provider as well.   To learn more about what you can do with MyChart, go to [email or web address].    Your next appointment:   Follow up as needed with Dr. Vincent Gros

## 2023-03-03 ENCOUNTER — Encounter: Payer: Self-pay | Admitting: Nephrology

## 2023-03-04 ENCOUNTER — Other Ambulatory Visit (HOSPITAL_COMMUNITY): Payer: Self-pay | Admitting: Nephrology

## 2023-03-04 ENCOUNTER — Other Ambulatory Visit: Payer: Self-pay | Admitting: Interventional Radiology

## 2023-03-04 DIAGNOSIS — Q613 Polycystic kidney, unspecified: Secondary | ICD-10-CM

## 2023-03-04 DIAGNOSIS — N281 Cyst of kidney, acquired: Secondary | ICD-10-CM

## 2023-03-11 ENCOUNTER — Other Ambulatory Visit: Payer: Self-pay | Admitting: Diagnostic Radiology

## 2023-03-11 ENCOUNTER — Ambulatory Visit
Admission: RE | Admit: 2023-03-11 | Discharge: 2023-03-11 | Disposition: A | Discharge status: Home / Self Care | Payer: BC Managed Care – PPO | Source: Ambulatory Visit | Attending: Interventional Radiology | Admitting: Interventional Radiology

## 2023-03-11 DIAGNOSIS — N281 Cyst of kidney, acquired: Secondary | ICD-10-CM

## 2023-03-11 NOTE — Progress Notes (Signed)
Chief Complaint: Patient was consulted remotely today (TeleHealth) for follow up of renal cysts  Referring Physician(s): Vallery Sa  History of Present Illness: Ann Bohne is a 63 y.o. female with polycystic kidney disease and symptomatic renal cysts.  Patient has had multiple bilateral renal cyst aspirations dating back to 2007.  Patient's last aspiration procedure was on 06/24/2022 where bilateral renal cysts were aspirated.  At that time, Dr. Grace Isaac discussed possible sclerotherapy of these renal cysts with the patient.  Patient subsequently had a CT of the abdomen and pelvis on 11/29/2022 to evaluate for sclerotherapy.  Currently, the patient has diffuse abdominal fullness with bilateral flank pain, left side is worse than right.  Patient has been dealing with the symptoms for many years.  Patient recently saw her nephrologist and she tells me that her GFR is currently 30.  Past Medical History:  Diagnosis Date   Anemia secondary to renal failure    Cardiomegaly    CKD (chronic kidney disease), stage IV (HCC)    Depression 12/01/2013   Diastasis recti 05/01/2016   Dyspareunia due to non-psychogenic cause in female 09/19/2018   Dyspnea    Heartburn 05/01/2016   Hyperlipemia    Hypertension, renal disease    Meningioma (HCC)    Multiple renal cysts    Nausea    PCK (polycystic kidney disease)    Polycystic renal disease    Secondary hyperparathyroidism (HCC)    Seizure (HCC) 04/20/2014   Stage 3b chronic kidney disease (CKD) (HCC)    Transient alteration of awareness 12/01/2013    Past Surgical History:  Procedure Laterality Date   ABDOMINAL HYSTERECTOMY     partial   APPENDECTOMY     CHOLECYSTECTOMY     IR RADIOLOGIST EVAL & MGMT  03/08/2021    Allergies: Percocet [oxycodone-acetaminophen]  Medications: Prior to Admission medications   Medication Sig Start Date End Date Taking? Authorizing Provider  acetaminophen (TYLENOL) 325 MG tablet Take 650 mg by  mouth as needed for mild pain (pain score 1-3) or moderate pain (pain score 4-6).    [provider]  amLODipine (NORVASC) 5 MG tablet Take 5 mg by mouth daily.    [provider]  JYNARQUE 15 MG TABS tablet Take 15 mg by mouth 2 (two) times daily. Patient not taking: Reported on 12/17/2022 12/04/22   [provider]  lisinopril (ZESTRIL) 40 MG tablet Take 40 mg by mouth daily.    [provider]  ondansetron (ZOFRAN-ODT) 4 MG disintegrating tablet Take 4 mg by mouth every 8 (eight) hours as needed for nausea or vomiting.    [provider]     Family History  Problem Relation Age of Onset   Polycystic kidney disease Mother    Cancer - Lung Father     Social History   Socioeconomic History   Marital status: Married    Spouse name: Not on file   Number of children: Not on file   Years of education: Not on file   Highest education level: Not on file  Occupational History   Not on file  Tobacco Use   Smoking status: Never   Smokeless tobacco: Never  Vaping Use   Vaping status: Never Used  Substance and Sexual Activity   Alcohol use: No   Drug use: No   Sexual activity: Not on file  Other Topics Concern   Not on file  Social History Narrative   Not on file   Social Drivers  of Health   Financial Resource Strain: Medium Risk (01/29/2023)   Received from [facility]   Overall Financial Resource Strain (CARDIA)    Difficulty of Paying Living Expenses: Somewhat hard  Food Insecurity: No Food Insecurity (01/29/2023)   Received from [facility]   Hunger Vital Sign    Worried About Running Out of Food in the Last Year: Never true    Ran Out of Food in the Last Year: Never true  Transportation Needs: No Transportation Needs (01/29/2023)   Received from [facility] - Transportation    Lack of Transportation (Medical): No    Lack of Transportation (Non-Medical): No  Physical Activity: Insufficiently Active (08/13/2022)    [facility]   Exercise Vital Sign    Days of Exercise per Week: 2 days    Minutes of Exercise per Session: 30 min  Stress: No Stress Concern Present (08/13/2022)   Received from [facility] - Occupational Stress Questionnaire    Feeling of Stress : Only a little  Social Connections: Socially Integrated (08/13/2022)   Received from [facility]   Social Network    How would you rate your social network (family, work, friends)?: Good participation with social networks      Review of Systems  Gastrointestinal:  Positive for abdominal distention.  Genitourinary:  Positive for flank pain.     Physical Exam No direct physical exam was performed   Vital Signs: There were no vitals taken for this visit.  Imaging: CLINICAL DATA:  Multiple bilateral renal cysts, aspirated every 6 months to 1 year. Mapping for sclerotherapy.   EXAM: CT ABDOMEN AND PELVIS WITHOUT CONTRAST   TECHNIQUE: Multidetector CT imaging of the abdomen and pelvis was performed following the standard protocol without IV contrast.   RADIATION DOSE REDUCTION: This exam was performed according to the departmental dose-optimization program which includes automated exposure control, adjustment of the mA and/or kV according to patient size and/or use of iterative reconstruction technique.   COMPARISON:  Renal ultrasound 03/21/2021 and CT abdomen pelvis 10/20/2014.   FINDINGS: Lower chest: Lung bases are clear. Heart is enlarged. No pericardial or pleural effusion. Distal esophagus is grossly unremarkable.   Hepatobiliary: Numerous small hepatic cysts. Cholecystectomy. No biliary ductal dilatation.   Pancreas: Negative.   Spleen: Negative.   Adrenals/Urinary Tract: Adrenal glands are unremarkable. Numerous low-attenuation lesions replace the renal parenchyma bilaterally, increased from 10/20/2014. Minimal scattered associated calcification in  the right kidney. Ureters are decompressed. Bladder is low in volume.   Stomach/Bowel: Stomach, small bowel and colon are unremarkable. Appendectomy per report.   Vascular/Lymphatic: Atherosclerotic calcification of the aorta. No pathologically enlarged lymph nodes.   Reproductive: Hysterectomy.  No adnexal mass.   Other: Small bilateral inguinal hernias contain fat. No free fluid. Mesenteries and peritoneum are unremarkable.   Musculoskeletal: Minimal degenerative change in the spine.   IMPRESSION: 1. Autosomal dominant polycystic kidney disease. 2. Numerous small hepatic cysts. 3.  Aortic atherosclerosis (ICD10-I70.0).     Electronically Signed   By: Leanna Battles M.D.   On: 12/04/2022 16:06  Labs:  CBC: Recent Labs    06/24/22 0709  WBC 3.8*  HGB 13.6  HCT 40.0  PLT 120*    COAGS: Recent Labs    06/24/22 0709  INR 1.0     Assessment and Plan:  63 year old with symptomatic polycystic kidney disease.  Patient has enlarged bilateral kidneys with innumerable bilateral cysts.  Patient has  been having image guided renal cyst aspirations since 2007 for symptomatic relief.  Usually the patient gets significant relief after these aspiration procedures.  Currently, the patient is complaining of abdominal fullness and bilateral flank pain, left side greater than right.  We discussed options which include continuing with ultrasound-guided aspiration of the bilateral renal cysts and the possibility of performing sclerotherapy.  I explained that sclerotherapy in this situation may be difficult because the patient has innumerable cysts and may be difficult to localize which cysts are causing her symptoms.  I explained the process of renal cyst sclerotherapy that usually includes leaving a catheter for several days and then follow-up imaging to make sure there is no connection to the renal collecting system.  After discussion of the sclerotherapy procedure, the patient was more  interested in continuing with ultrasound-guided aspirations of the symptomatic cysts rather than sclerotherapy.  I suggested performing the aspirations more regularly in order to control her symptoms better.  Patient is agreeable with this plan and we will schedule her for ultrasound-guided bilateral renal cyst aspiration with moderate sedation.   Electronically Signed: Arn Medal 03/11/2023, 11:38 AM   I spent a total of  20 minutes in remote  clinical consultation, greater than 50% of which was counseling/coordinating care for symptomatic renal cysts.    Visit type: Audio only (telephone). Audio (no video) only due to patient preference. Alternative for in-person consultation at [facility].  Patient ID: Renold Genta, female   DOB: 1960-11-24, 63 y.o.   MRN: [id number]

## 2023-03-13 ENCOUNTER — Telehealth: Payer: BC Managed Care – PPO

## 2023-04-01 ENCOUNTER — Other Ambulatory Visit: Payer: Self-pay | Admitting: Interventional Radiology

## 2023-04-01 ENCOUNTER — Other Ambulatory Visit: Payer: Self-pay | Admitting: Diagnostic Radiology

## 2023-04-01 DIAGNOSIS — N281 Cyst of kidney, acquired: Secondary | ICD-10-CM

## 2023-04-01 DIAGNOSIS — Q614 Renal dysplasia: Secondary | ICD-10-CM

## 2023-04-01 DIAGNOSIS — Z01818 Encounter for other preprocedural examination: Secondary | ICD-10-CM

## 2023-04-02 ENCOUNTER — Other Ambulatory Visit: Payer: Self-pay | Admitting: Diagnostic Radiology

## 2023-04-02 ENCOUNTER — Other Ambulatory Visit: Payer: Self-pay

## 2023-04-02 ENCOUNTER — Ambulatory Visit (HOSPITAL_COMMUNITY)
Admission: RE | Admit: 2023-04-02 | Discharge: 2023-04-02 | Disposition: A | Discharge status: Home / Self Care | Payer: BC Managed Care – PPO | Source: Ambulatory Visit | Attending: Diagnostic Radiology | Admitting: Diagnostic Radiology

## 2023-04-02 DIAGNOSIS — Q614 Renal dysplasia: Secondary | ICD-10-CM

## 2023-04-02 DIAGNOSIS — Z5986 Financial insecurity: Secondary | ICD-10-CM | POA: Insufficient documentation

## 2023-04-02 DIAGNOSIS — N184 Chronic kidney disease, stage 4 (severe): Secondary | ICD-10-CM | POA: Diagnosis not present

## 2023-04-02 DIAGNOSIS — N281 Cyst of kidney, acquired: Secondary | ICD-10-CM

## 2023-04-02 DIAGNOSIS — Z01818 Encounter for other preprocedural examination: Secondary | ICD-10-CM

## 2023-04-02 LAB — CBC
HCT: 40.1 % (ref 36.0–46.0)
Hemoglobin: 13.7 g/dL (ref 12.0–15.0)
MCH: 31.6 pg (ref 26.0–34.0)
MCHC: 34.2 g/dL (ref 30.0–36.0)
MCV: 92.4 fL (ref 80.0–100.0)
Platelets: 203 10*3/uL (ref 150–400)
RBC: 4.34 MIL/uL (ref 3.87–5.11)
RDW: 12.9 % (ref 11.5–15.5)
WBC: 10 10*3/uL (ref 4.0–10.5)
nRBC: 0 % (ref 0.0–0.2)

## 2023-04-02 LAB — PROTIME-INR
INR: 1 (ref 0.8–1.2)
Prothrombin Time: 13.3 s (ref 11.4–15.2)

## 2023-04-02 MED ORDER — LIDOCAINE HCL (PF) 1 % IJ SOLN
7.0000 mL | Freq: Once | INTRAMUSCULAR | Status: AC
Start: 2023-04-02 — End: 2023-04-02
  Administered 2023-04-02: 7 mL via INTRADERMAL

## 2023-04-02 MED ORDER — FENTANYL CITRATE (PF) 100 MCG/2ML IJ SOLN
INTRAMUSCULAR | Status: AC | PRN
  Administered 2023-04-02 (×2): 25 ug via INTRAVENOUS
  Administered 2023-04-02: 50 ug via INTRAVENOUS
  Administered 2023-04-02 (×2): 25 ug via INTRAVENOUS

## 2023-04-02 MED ORDER — FENTANYL CITRATE (PF) 100 MCG/2ML IJ SOLN
INTRAMUSCULAR | Status: AC
  Filled 2023-04-02: qty 2

## 2023-04-02 MED ORDER — MIDAZOLAM HCL 2 MG/2ML IJ SOLN
INTRAMUSCULAR | Status: AC
  Filled 2023-04-02: qty 2

## 2023-04-02 MED ORDER — MIDAZOLAM HCL 2 MG/2ML IJ SOLN
INTRAMUSCULAR | Status: AC | PRN
  Administered 2023-04-02 (×2): .5 mg via INTRAVENOUS
  Administered 2023-04-02 (×2): 1 mg via INTRAVENOUS

## 2023-04-02 NOTE — H&P (Signed)
 Chief Complaint: Patient was seen in consultation today for renal cyst aspiration  Referring Physician(s): Henn,Adam  Supervising Physician: Marliss Coots  Patient Status: [name] - Out-pt  History of Present Illness: Debbie Ward is a 63 y.o. female known to IR service. History of polycystic renal cysts s/p multiple cyst aspirations bilaterally. Was seen in clinic with Dr. Lowella Dandy to discuss repeat aspiration and possible sclerotherapy. After discussion, pt is now scheduled for US guided aspiration only.  PMHx, meds, labs, imaging, allergies reviewed. Feels well, no recent fevers, chills, illness. Has been NPO today as directed. Family at bedside.   Past Medical History:  Diagnosis Date   Anemia secondary to renal failure    Cardiomegaly    CKD (chronic kidney disease), stage IV (HCC)    Depression 12/01/2013   Diastasis recti 05/01/2016   Dyspareunia due to non-psychogenic cause in female 09/19/2018   Dyspnea    Heartburn 05/01/2016   Hyperlipemia    Hypertension, renal disease    Meningioma (HCC)    Multiple renal cysts    Nausea    PCK (polycystic kidney disease)    Polycystic renal disease    Secondary hyperparathyroidism (HCC)    Seizure (HCC) 04/20/2014   Stage 3b chronic kidney disease (CKD) (HCC)    Transient alteration of awareness 12/01/2013    Past Surgical History:  Procedure Laterality Date   ABDOMINAL HYSTERECTOMY     partial   APPENDECTOMY     CHOLECYSTECTOMY     IR RADIOLOGIST EVAL & MGMT  03/08/2021    Allergies: Percocet [oxycodone-acetaminophen]  Medications: Prior to Admission medications   Medication Sig Start Date End Date Taking? Authorizing Provider  acetaminophen (TYLENOL) 325 MG tablet Take 650 mg by mouth as needed for mild pain (pain score 1-3) or moderate pain (pain score 4-6).   Yes [provider]  amLODipine (NORVASC) 5 MG tablet Take 5 mg by mouth daily.   Yes [provider]  lisinopril (ZESTRIL) 40 MG  tablet Take 40 mg by mouth daily.   Yes [provider]  ondansetron (ZOFRAN-ODT) 4 MG disintegrating tablet Take 4 mg by mouth every 8 (eight) hours as needed for nausea or vomiting.   Yes [provider]  JYNARQUE 15 MG TABS tablet Take 15 mg by mouth 2 (two) times daily. Patient not taking: Reported on 12/17/2022 12/04/22   [provider]     Family History  Problem Relation Age of Onset   Polycystic kidney disease Mother    Cancer - Lung Father     Social History   Socioeconomic History   Marital status: Married    Spouse name: Not on file   Number of children: Not on file   Years of education: Not on file   Highest education level: Not on file  Occupational History   Not on file  Tobacco Use   Smoking status: Never   Smokeless tobacco: Never  Vaping Use   Vaping status: Never Used  Substance and Sexual Activity   Alcohol use: No   Drug use: No   Sexual activity: Not on file  Other Topics Concern   Not on file  Social History Narrative   Not on file   Social Drivers of Health   Financial Resource Strain: Medium Risk (01/29/2023)   Received from [facility]   Overall Financial Resource Strain (CARDIA)    Difficulty of Paying Living Expenses: Somewhat hard  Food Insecurity: No Food Insecurity (01/29/2023)   Received  from [facility]   Hunger Vital Sign    Worried About Running Out of Food in the Last Year: Never true    Ran Out of Food in the Last Year: Never true  Transportation Needs: No Transportation Needs (01/29/2023)   Received from [facility] - Transportation    Lack of Transportation (Medical): No    Lack of Transportation (Non-Medical): No  Physical Activity: Insufficiently Active (08/13/2022)   Received from [facility]   Exercise Vital Sign    Days of Exercise per Week: 2 days    Minutes of Exercise per Session: 30 min  Stress: No Stress Concern Present (08/13/2022)   Received from [facility] - Occupational Stress Questionnaire    Feeling of Stress : Only a little  Social Connections: Socially Integrated (08/13/2022)   Received from [facility]   Social Network    How would you rate your social network (family, work, friends)?: Good participation with social networks    Review of Systems: A 12 point ROS discussed and pertinent positives are indicated in the HPI above.  All other systems are negative.  Review of Systems  Vital Signs: BP (!) 146/71   Pulse 67   Temp 97.7 F (36.5 C) (Oral)   Resp 20   Ht 5\' 4"  (1.626 m)   Wt 220 lb (99.8 kg)   SpO2 96%   BMI 37.76 kg/m   Physical Exam Constitutional:      Appearance: She is not ill-appearing.  HENT:     Mouth/Throat:     Mouth: Mucous membranes are moist.     Pharynx: Oropharynx is clear.  Cardiovascular:     Rate and Rhythm: Normal rate and regular rhythm.     Heart sounds: Normal heart sounds.  Pulmonary:     Effort: Pulmonary effort is normal. No respiratory distress.     Breath sounds: Normal breath sounds.  Abdominal:     General: There is distension.     Palpations: Abdomen is soft.     Tenderness: There is no abdominal tenderness.  Skin:    General: Skin is warm and dry.  Neurological:     General: No focal deficit present.     Mental Status: She is alert and oriented to person, place, and time.  Psychiatric:        Mood and Affect: Mood normal.        Thought Content: Thought content normal.     Imaging: No results found.  Labs:  CBC: Recent Labs    06/24/22 0709 04/02/23 0636  WBC 3.8* 10.0  HGB 13.6 13.7  HCT 40.0 40.1  PLT 120* 203    COAGS: Recent Labs    06/24/22 0709 04/02/23 0636  INR 1.0 1.0     Assessment and Plan: Symptomatic Renal cysts from polycystic kidney disease For US guided aspiration. Labs reviewed. Risks and benefits of renal cyst aspiration was discussed with the patient and/or patient's family including, but not  limited to bleeding, infection, damage to adjacent structures or low yield requiring additional tests.  All of the questions were answered and there is agreement to proceed.  Consent signed and in chart.   Electronically Signed: Brayton El, PA-C 04/02/2023, 7:43 AM   I spent a total of 20 minutes in face to face in clinical consultation, greater than 50% of which was counseling/coordinating care for renal cyst aspiration

## 2023-04-02 NOTE — Progress Notes (Signed)
 Patient called out at 1100, as she needed to use the bathroom. Patient reminded of her bedrest time, but she did not want to use the other options available. Patient was very unhappy. This RN, checked back on her at 1130. Patient was already dressed and went to the restroom herself.

## 2023-04-02 NOTE — Procedures (Signed)
 Interventional Radiology Procedure Note  Procedure: Ultrasound guided bilateral renal cyst aspiration  Findings: Please refer to procedural dictation for full description. 500 mL (left kidney) and 200 mL (right kidney) of  translucent, straw colored fluid aspirated.  Complications: none immediate  Estimated Blood Loss: < 5 ml  Recommendations: 3 hours bedrest. IR will arrange follow up for left renal cyst sclerotherapy with general anesthesia in the near future.   Marliss Coots, MD

## 2023-04-03 ENCOUNTER — Other Ambulatory Visit: Payer: Self-pay | Admitting: Interventional Radiology

## 2023-04-03 DIAGNOSIS — N281 Cyst of kidney, acquired: Secondary | ICD-10-CM

## 2023-04-17 NOTE — Progress Notes (Signed)
 This encounter was conducted via the [employer] providing interactive audio and visual communication.  The patient provided verbal consent to conduct a virtual appointment.  The patient was located at their primary residence during this encounter.  Referring Physician(s): Established patient   Chief Complaint: The patient is seen in virtual video consultation today for recurrent bilateral renal cysts.   History of present illness: Debbie Ward, 63 year old female, has a medical history significant for HTN, depression, anemia, chronic kidney disease and polycystic kidney disease. She is followed by [facility] and she is familiar to IR from multiple bilateral renal cyst aspirations. Her last procedure was 04/02/23 and I aspirated 500 ml from the left kidney and 200 ml from the right kidney. She presents today in virtual video consultation to discuss a definitive plan for sclerotherapy.   She reports experiencing significant relief after the most recent aspiration.  No hematuria or UTI.  She would like to proceed with sclerotherapy in the future.  Past Medical History:  Diagnosis Date   Anemia secondary to renal failure    Cardiomegaly    CKD (chronic kidney disease), stage IV (HCC)    Depression 12/01/2013   Diastasis recti 05/01/2016   Dyspareunia due to non-psychogenic cause in female 09/19/2018   Dyspnea    Heartburn 05/01/2016   Hyperlipemia    Hypertension, renal disease    Meningioma (HCC)    Multiple renal cysts    Nausea    PCK (polycystic kidney disease)    Polycystic renal disease    Secondary hyperparathyroidism (HCC)    Seizure (HCC) 04/20/2014   Stage 3b chronic kidney disease (CKD) (HCC)    Transient alteration of awareness 12/01/2013    Past Surgical History:  Procedure Laterality Date   ABDOMINAL HYSTERECTOMY     partial   APPENDECTOMY     CHOLECYSTECTOMY     IR RADIOLOGIST EVAL & MGMT  03/08/2021    Allergies: Percocet  [oxycodone-acetaminophen]  Medications: Prior to Admission medications   Medication Sig Start Date End Date Taking? Authorizing Provider  acetaminophen (TYLENOL) 325 MG tablet Take 650 mg by mouth as needed for mild pain (pain score 1-3) or moderate pain (pain score 4-6).    [provider]  amLODipine (NORVASC) 5 MG tablet Take 5 mg by mouth daily.    [provider]  JYNARQUE 15 MG TABS tablet Take 15 mg by mouth 2 (two) times daily. Patient not taking: Reported on 12/17/2022 12/04/22   [provider]  lisinopril (ZESTRIL) 40 MG tablet Take 40 mg by mouth daily.    [provider]  ondansetron (ZOFRAN-ODT) 4 MG disintegrating tablet Take 4 mg by mouth every 8 (eight) hours as needed for nausea or vomiting.    [provider]     Family History  Problem Relation Age of Onset   Polycystic kidney disease Mother    Cancer - Lung Father     Social History   Socioeconomic History   Marital status: Married    Spouse name: Not on file   Number of children: Not on file   Years of education: Not on file   Highest education level: Not on file  Occupational History   Not on file  Tobacco Use   Smoking status: Never   Smokeless tobacco: Never  Vaping Use   Vaping status: Never Used  Substance and Sexual Activity   Alcohol use: No   Drug use: No   Sexual activity: Not on file  Other Topics Concern   Not on file  Social History Narrative   Not on file   Social Drivers of Health   Financial Resource Strain: Medium Risk (01/29/2023)   Received from [facility]   Overall Financial Resource Strain (CARDIA)    Difficulty of Paying Living Expenses: Somewhat hard  Food Insecurity: No Food Insecurity (01/29/2023)   Received from [facility]   Hunger Vital Sign    Worried About Running Out of Food in the Last Year: Never true    Ran Out of Food in the Last Year: Never true  Transportation Needs: No Transportation Needs (01/29/2023)    Received from [facility] - Transportation    Lack of Transportation (Medical): No    Lack of Transportation (Non-Medical): No  Physical Activity: Insufficiently Active (08/13/2022)   Received from [facility]   Exercise Vital Sign    Days of Exercise per Week: 2 days    Minutes of Exercise per Session: 30 min  Stress: No Stress Concern Present (08/13/2022)   Received from [facility] - Occupational Stress Questionnaire    Feeling of Stress : Only a little  Social Connections: Socially Integrated (08/13/2022)   Received from [facility]   Social Network    How would you rate your social network (family, work, friends)?: Good participation with social networks     Vital Signs: There were no vitals taken for this visit.  Physical exam: Patient is alert, oriented and able to participate fully in the conversation. No apparent discomfort or distress observed. She appears appropriately dressed.    Imaging: CT AP 12/15/22   Labs:  CBC: Recent Labs    06/24/22 0709 04/02/23 0636  WBC 3.8* 10.0  HGB 13.6 13.7  HCT 40.0 40.1  PLT 120* 203    COAGS: Recent Labs    06/24/22 0709 04/02/23 0636  INR 1.0 1.0    BMP: No results for input(s): "NA", "K", "CL", "CO2", "GLUCOSE", "BUN", "CALCIUM", "CREATININE", "GFRNONAA", "GFRAA" in the last 8760 hours.  Invalid input(s): "CMP"  LIVER FUNCTION TESTS: No results for input(s): "BILITOT", "AST", "ALT", "ALKPHOS", "PROT", "ALBUMIN" in the last 8760 hours.  Assessment and Plan: 63 year old female with a history of polycystic kidney disease with recurrent symptomatic bilateral renal cysts. At the time of her last cyst aspiration, we discussed the possibility of performing ethanol sclerotherapy to her cysts to permanently shrink them and prevent refilling.  She would still like to proceed with this.  Given her discomfort during her prior aspirations, the ablation procedure  will require general anesthesia.  We discussed that this may take several sessions over several years to get her to the comfort level she desires.    Plan for ultrasound guided bilateral renal cyst aspiration and ethanol ablation in Interventional Radiology with General Anesthesia at [facility].  Plan for antibiotic prophylaxis and same day discharge home.    I spent a total of 25 Minutes in virtual video clinical consultation, greater than 50% of which was counseling/coordinating care for recurrent bilateral renal cysts.     Marliss Coots, MD Pager: [phone]

## 2023-04-18 ENCOUNTER — Ambulatory Visit
Admission: RE | Admit: 2023-04-18 | Discharge: 2023-04-18 | Disposition: A | Discharge status: Home / Self Care | Source: Ambulatory Visit | Attending: Interventional Radiology | Admitting: Interventional Radiology

## 2023-04-18 DIAGNOSIS — N281 Cyst of kidney, acquired: Secondary | ICD-10-CM

## 2023-04-18 HISTORY — PX: IR RADIOLOGIST EVAL & MGMT: IMG5224

## 2023-04-22 ENCOUNTER — Other Ambulatory Visit: Payer: Self-pay | Admitting: Interventional Radiology

## 2023-04-22 DIAGNOSIS — N281 Cyst of kidney, acquired: Secondary | ICD-10-CM

## 2023-04-28 ENCOUNTER — Other Ambulatory Visit (HOSPITAL_COMMUNITY): Payer: Self-pay | Admitting: Interventional Radiology

## 2023-04-28 DIAGNOSIS — N281 Cyst of kidney, acquired: Secondary | ICD-10-CM

## 2023-06-03 ENCOUNTER — Other Ambulatory Visit: Payer: Self-pay | Admitting: Radiology

## 2023-06-03 DIAGNOSIS — Q613 Polycystic kidney, unspecified: Secondary | ICD-10-CM

## 2023-06-03 NOTE — Progress Notes (Signed)
Surgery orders requested with radiology. 

## 2023-06-05 NOTE — Progress Notes (Signed)
 COVID Vaccine received:  [x]  No []  Yes Date of any COVID positive Test in last 90 days:  none  PCP - Estrellita Henderson, NP  Cardiologist - Bertha Broad, MD  Nephrology- Dr. Shaune Delaine at [facility] [place] office [phone]  Neurosurgery- Armando Berne, MD at [facility] [phone] (Work)  [phone] (Fax)   Chest x-ray - 10-01-2017 2v Epic,  repeat ordered EKG -  12-17-2022  Epic Stress Test - Lexiscan  10-30-2017 ECHO - 12-11-2012  Epic Cardiac Cath -   Bowel Prep - [x]  No  []   Yes ______  Pacemaker / ICD device [x]  No []  Yes   Spinal Cord Stimulator:[x]  No []  Yes       History of Sleep Apnea? [x]  No []  Yes   CPAP used?- [x]  No []  Yes    Does the patient monitor blood sugar?   [x]  N/A   []  No []  Yes  Patient has: [x]  NO Hx DM   []  Pre-DM   []  DM1  []   DM2  Blood Thinner / Instructions:  none Aspirin Instructions:  none  ERAS Protocol Ordered: [x]  No  []  Yes Patient is to be NPO after:  MN  Dental hx: []  Dentures:  [x]  N/A      []  Bridge or Partial:                   []  Loose or Damaged teeth:   Activity level: Patient is able to climb a flight of stairs without difficulty; [x]  No CP  but would have SOB   Patient can  perform ADLs without assistance.   Anesthesia review: HTN, polycystic renal disease- CKD4 (GFR 25 today- Patient will soon be on renal transplant list at [facility]), Meningioma- stable x 10 years per patient, depression,  CM  Patient denies shortness of breath, fever, cough and chest pain at PAT appointment.  Patient verbalized understanding and agreement to the Pre-Surgical Instructions that were given to them at this PAT appointment. Patient was also educated of the need to review these PAT instructions again prior to her surgery.I reviewed the appropriate phone numbers to call if they have any and questions or concerns.

## 2023-06-05 NOTE — Patient Instructions (Signed)
 SURGICAL WAITING ROOM VISITATION Patients having surgery or a procedure may have no more than 2 support people in the waiting area - these visitors may rotate in the visitor waiting room.   If the patient needs to stay at the hospital during part of their recovery, the visitor guidelines for inpatient rooms apply.  PRE-OP VISITATION  Pre-op nurse will coordinate an appropriate time for 1 support person to accompany the patient in pre-op.  This support person may not rotate.  This visitor will be contacted when the time is appropriate for the visitor to come back in the pre-op area.  Please refer to the [facility] website for the visitor guidelines for Inpatients (after your surgery is over and you are in a regular room).  You are not required to quarantine at this time prior to your surgery. However, you must do this: Hand Hygiene often Do NOT share personal items Notify your provider if you are in close contact with someone who has COVID or you develop fever 100.4 or greater, new onset of sneezing, cough, sore throat, shortness of breath or body aches.  If you test positive for Covid or have been in contact with anyone that has tested positive in the last 10 days please notify you surgeon.    Your procedure is scheduled on:  TUESDAY  Jun 10, 2023  Report to [facility] Main Entrance: [address] entrance where the [employer] is available.   Report to admitting at: 06:30  AM  Call this number if you have any questions or problems the morning of surgery [phone]  DO NOT EAT OR DRINK ANYTHING AFTER MIDNIGHT THE NIGHT PRIOR TO YOUR SURGERY / PROCEDURE.   FOLLOW  ANY ADDITIONAL PRE OP INSTRUCTIONS YOU RECEIVED FROM YOUR SURGEON'S OFFICE!!!   Oral Hygiene is also important to reduce your risk of infection.        Remember - BRUSH YOUR TEETH THE MORNING OF SURGERY WITH YOUR REGULAR TOOTHPASTE  Do NOT smoke after Midnight the night before surgery.  STOP TAKING all Vitamins,  Herbs and supplements 1 week before your surgery.   Take ONLY these medicines the morning of surgery with A SIP OF WATER: omeprazole, amlodipine and you may take Tylenol  if needed. You may use your Albuterol inhaler if needed. (Please bring your inhaler with you on the day of surgery)                    You may not have any metal on your body including hair pins, jewelry, and body piercing  Do not wear make-up, lotions, powders, perfumes  or deodorant  Do not wear nail polish including gel and S&S, artificial / acrylic nails, or any other type of covering on natural nails including finger and toenails. If you have artificial nails, gel coating, etc., that needs to be removed by a nail salon, Please have this removed prior to surgery. Not doing so may mean that your surgery could be cancelled or delayed if the Surgeon or anesthesia staff feels like they are unable to monitor you safely.   Do not shave 48 hours prior to surgery to avoid nicks in your skin which may contribute to postoperative infections.   Contacts, Hearing Aids, dentures or bridgework may not be worn into surgery. DENTURES WILL BE REMOVED PRIOR TO SURGERY PLEASE DO NOT APPLY "Poly grip" OR ADHESIVES!!!  Patients discharged on the day of surgery will not be allowed to drive home.  Someone NEEDS to  stay with you for the first 24 hours after anesthesia.  Do not bring your home medications to the hospital. The Pharmacy will dispense medications listed on your medication list to you during your admission in the Hospital.  Please read over the following fact sheets you were given: IF YOU HAVE QUESTIONS ABOUT YOUR PRE-OP INSTRUCTIONS, PLEASE CALL [phone].   [place] - Preparing for Surgery Before surgery, you can play an important role.  Because skin is not sterile, your skin needs to be as free of germs as possible.  You can reduce the number of germs on your skin by washing with CHG (chlorahexidine gluconate) soap before  surgery.  CHG is an antiseptic cleaner which kills germs and bonds with the skin to continue killing germs even after washing. Please DO NOT use if you have an allergy to CHG or antibacterial soaps.  If your skin becomes reddened/irritated stop using the CHG and inform your nurse when you arrive at Short Stay. Do not shave (including legs and underarms) for at least 48 hours prior to the first CHG shower.  You may shave your face/neck.  Please follow these instructions carefully:  1.  Shower with CHG Soap the night before surgery and the  morning of surgery.  2.  If you choose to wash your hair, wash your hair first as usual with your normal  shampoo.  3.  After you shampoo, rinse your hair and body thoroughly to remove the shampoo.                             4.  Use CHG as you would any other liquid soap.  You can apply chg directly to the skin and wash.  Gently with a scrungie or clean washcloth.  5.  Apply the CHG Soap to your body ONLY FROM THE NECK DOWN.   Do not use on face/ open                           Wound or open sores. Avoid contact with eyes, ears mouth and genitals (private parts).                       Wash face,  Genitals (private parts) with your normal soap.             6.  Wash thoroughly, paying special attention to the area where your  surgery  will be performed.  7.  Thoroughly rinse your body with warm water from the neck down.  8.  DO NOT shower/wash with your normal soap after using and rinsing off the CHG Soap.            9.  Pat yourself dry with a clean towel.            10.  Wear clean pajamas.            11.  Place clean sheets on your bed the night of your first shower and do not  sleep with pets.  ON THE DAY OF SURGERY : Do not apply any lotions/deodorants the morning of surgery.  Please wear clean clothes to the hospital/surgery center.    FAILURE TO FOLLOW THESE INSTRUCTIONS MAY RESULT IN THE CANCELLATION OF YOUR SURGERY  PATIENT  SIGNATURE_________________________________  NURSE SIGNATURE__________________________________  ________________________________________________________________________

## 2023-06-06 ENCOUNTER — Encounter (HOSPITAL_COMMUNITY): Payer: Self-pay

## 2023-06-06 ENCOUNTER — Encounter (HOSPITAL_COMMUNITY)
Admission: RE | Admit: 2023-06-06 | Discharge: 2023-06-06 | Disposition: A | Discharge status: Home / Self Care | Source: Ambulatory Visit | Attending: Interventional Radiology | Admitting: Interventional Radiology

## 2023-06-06 ENCOUNTER — Other Ambulatory Visit: Payer: Self-pay

## 2023-06-06 ENCOUNTER — Ambulatory Visit (HOSPITAL_COMMUNITY)
Admission: RE | Admit: 2023-06-06 | Discharge: 2023-06-06 | Disposition: A | Discharge status: Home / Self Care | Source: Ambulatory Visit | Attending: Radiology | Admitting: Radiology

## 2023-06-06 DIAGNOSIS — Z01812 Encounter for preprocedural laboratory examination: Secondary | ICD-10-CM | POA: Diagnosis present

## 2023-06-06 DIAGNOSIS — Z01818 Encounter for other preprocedural examination: Secondary | ICD-10-CM | POA: Diagnosis not present

## 2023-06-06 DIAGNOSIS — Z01811 Encounter for preprocedural respiratory examination: Secondary | ICD-10-CM | POA: Diagnosis present

## 2023-06-06 DIAGNOSIS — Q613 Polycystic kidney, unspecified: Secondary | ICD-10-CM | POA: Diagnosis not present

## 2023-06-06 HISTORY — DX: Unspecified osteoarthritis, unspecified site: M19.90

## 2023-06-06 HISTORY — DX: Other complications of anesthesia, initial encounter: T88.59XA

## 2023-06-06 HISTORY — DX: Pneumonia, unspecified organism: J18.9

## 2023-06-06 HISTORY — DX: Essential (primary) hypertension: I10

## 2023-06-06 HISTORY — DX: Gastro-esophageal reflux disease without esophagitis: K21.9

## 2023-06-06 HISTORY — DX: Cardiac arrhythmia, unspecified: I49.9

## 2023-06-06 LAB — CBC WITH DIFFERENTIAL/PLATELET
Abs Immature Granulocytes: 0.01 10*3/uL (ref 0.00–0.07)
Basophils Absolute: 0 10*3/uL (ref 0.0–0.1)
Basophils Relative: 1 %
Eosinophils Absolute: 0.1 10*3/uL (ref 0.0–0.5)
Eosinophils Relative: 2 %
HCT: 41.8 % (ref 36.0–46.0)
Hemoglobin: 13.9 g/dL (ref 12.0–15.0)
Immature Granulocytes: 0 %
Lymphocytes Relative: 25 %
Lymphs Abs: 1.2 10*3/uL (ref 0.7–4.0)
MCH: 31.4 pg (ref 26.0–34.0)
MCHC: 33.3 g/dL (ref 30.0–36.0)
MCV: 94.6 fL (ref 80.0–100.0)
Monocytes Absolute: 0.4 10*3/uL (ref 0.1–1.0)
Monocytes Relative: 7 %
Neutro Abs: 3.3 10*3/uL (ref 1.7–7.7)
Neutrophils Relative %: 65 %
Platelets: 164 10*3/uL (ref 150–400)
RBC: 4.42 MIL/uL (ref 3.87–5.11)
RDW: 13 % (ref 11.5–15.5)
WBC: 5 10*3/uL (ref 4.0–10.5)
nRBC: 0 % (ref 0.0–0.2)

## 2023-06-06 LAB — BASIC METABOLIC PANEL WITH GFR
Anion gap: 9 (ref 5–15)
BUN: 34 mg/dL — ABNORMAL HIGH (ref 8–23)
CO2: 20 mmol/L — ABNORMAL LOW (ref 22–32)
Calcium: 8.9 mg/dL (ref 8.9–10.3)
Chloride: 109 mmol/L (ref 98–111)
Creatinine, Ser: 2.18 mg/dL — ABNORMAL HIGH (ref 0.44–1.00)
GFR, Estimated: 25 mL/min — ABNORMAL LOW (ref >60)
Glucose, Bld: 109 mg/dL — ABNORMAL HIGH (ref 70–99)
Potassium: 4 mmol/L (ref 3.5–5.1)
Sodium: 138 mmol/L (ref 135–145)

## 2023-06-06 LAB — PROTIME-INR
INR: 1 (ref 0.8–1.2)
Prothrombin Time: 12.8 s (ref 11.4–15.2)

## 2023-06-09 ENCOUNTER — Other Ambulatory Visit: Payer: Self-pay | Admitting: Radiology

## 2023-06-09 DIAGNOSIS — N281 Cyst of kidney, acquired: Secondary | ICD-10-CM

## 2023-06-09 NOTE — Progress Notes (Signed)
 Case: [id number] Date/Time: 06/10/23 0815   Procedure: RADIOLOGY WITH ANESTHESIA (Bilateral)   Anesthesia type: General   Diagnosis: Renal cyst [N28.1]   Pre-op diagnosis: bilateral renal cysts   [facility]: [facility] / [facility]   Surgeons: Radiologist, Medication, MD       DISCUSSION: Debbie Ward is a 63 yo female who presents to PAT prior to surgery above. PMH of HTN, ADPKD, CKD stage 3-4, meningioma with remote seizure.   Seen by Dr. Ronell Coe with Cardiology on 12/17/22 due to enlarged heart seen on CT scan. She had a subsequent echo which was normal. Per Dr. Ronell Coe:  "No evidence of cardiomegaly as reviewed on echocardiogram images. No further cardiac workup required at this time.   If in future she needs ischemic evaluation or coronary artery disease in the setting of her anticipated transplant needs, will get assess with cardiac PET stress or coronary angiogram by invasive or noninvasive methods depending on her renal status [further on dialysis or not]."  Pt has not started [facility]. Plans to start after surgery.  VS: BP 127/69 Comment: right arm sitting  Pulse (!) 50   Temp 37.1 C (Oral)   Resp 16   Ht 5\' 4"  (1.626 m)   Wt 93.4 kg   SpO2 99%   BMI 35.36 kg/m   PROVIDERS: Estrellita Henderson, NP Nephrology- Dr. Shaune Delaine at [facility]  LABS: Labs reviewed: Acceptable for surgery. (all labs ordered are listed, but only abnormal results are displayed)  Labs Reviewed  BASIC METABOLIC PANEL WITH GFR - Abnormal; Notable for the following components:      Result Value   CO2 20 (*)    Glucose, Bld 109 (*)    BUN 34 (*)    Creatinine, Ser 2.18 (*)    GFR, Estimated 25 (*)    All other components within normal limits  CBC WITH DIFFERENTIAL/PLATELET  PROTIME-INR     IMAGES: CXR 06/06/23:  FINDINGS: The cardiomediastinal contours are normal. The lungs are clear. Pulmonary vasculature is normal. No consolidation, pleural effusion, or pneumothorax. No acute osseous  abnormalities are seen.   IMPRESSION: No active disease.   CV:  Echo 12/12/22:  IMPRESSIONS    1. Left ventricular ejection fraction, by estimation, is 55 to 60%. The left ventricle has normal function. The left ventricle has no regional wall motion abnormalities. Left ventricular diastolic parameters are consistent with Grade I diastolic dysfunction (impaired relaxation). The average left ventricular global longitudinal strain is -19.9 %. The global longitudinal strain is normal.  2. Right ventricular systolic function is normal. The right ventricular size is normal. There is normal pulmonary artery systolic pressure.  3. The mitral valve is normal in structure. No evidence of mitral valve regurgitation. No evidence of mitral stenosis.  4. The aortic valve is normal in structure. Aortic valve regurgitation is not visualized. No aortic stenosis is present.  5. The inferior vena cava is normal in size with greater than 50% respiratory variability, suggesting right atrial pressure of 3 mmHg.  Stress test 10/30/2017:  Nuclear stress EF: 68%. There was no ST segment deviation noted during stress. No T wave inversion was noted during stress. The study is normal. The left ventricular ejection fraction is hyperdynamic (>65%).   Past Medical History:  Diagnosis Date   Anemia secondary to renal failure    Arthritis    Cardiomegaly    CKD (chronic kidney disease), stage IV (HCC)    Complication of anesthesia    hard to wake  up   Depression 12/01/2013   Diastasis recti 05/01/2016   Dyspareunia due to non-psychogenic cause in female 09/19/2018   Dyspnea    Dysrhythmia    Bradycardia   GERD (gastroesophageal reflux disease)    Heartburn 05/01/2016   Hyperlipemia    Hypertension    Hypertension, renal disease    Meningioma (HCC)    Multiple renal cysts    Nausea    PCK (polycystic kidney disease)    Pneumonia    Polycystic renal disease    Secondary hyperparathyroidism  (HCC)    Seizure (HCC) 04/20/2014   Stage 3b chronic kidney disease (CKD) (HCC)    Transient alteration of awareness 12/01/2013    Past Surgical History:  Procedure Laterality Date   ABDOMINAL HYSTERECTOMY     partial   APPENDECTOMY     CHOLECYSTECTOMY     laparoscopic   IR RADIOLOGIST EVAL & MGMT  03/08/2021   IR RADIOLOGIST EVAL & MGMT  04/18/2023   WISDOM TOOTH EXTRACTION      MEDICATIONS:  acetaminophen  (TYLENOL ) 650 MG CR tablet   albuterol (VENTOLIN HFA) 108 (90 Base) MCG/ACT inhaler   amLODipine (NORVASC) 5 MG tablet   cyanocobalamin (VITAMIN B12) 1000 MCG tablet   JYNARQUE 15 MG TABS tablet   lisinopril (ZESTRIL) 40 MG tablet   omeprazole (PRILOSEC OTC) 20 MG tablet   ondansetron (ZOFRAN-ODT) 4 MG disintegrating tablet   Semaglutide-Weight Management (WEGOVY) 0.25 MG/0.5ML SOAJ   No current facility-administered medications for this encounter.   Antoinette Kirschner [facility] Short Stay/Anesthesiology [facility] Phone [phone] 06/09/2023 9:36 AM

## 2023-06-09 NOTE — Anesthesia Preprocedure Evaluation (Addendum)
 Anesthesia Evaluation  Patient identified by MRN, date of birth, ID band Patient awake    Reviewed: Allergy & Precautions, NPO status , Patient's Chart, lab work & pertinent test results  History of Anesthesia Complications (+) PROLONGED EMERGENCE and history of anesthetic complications  Airway Mallampati: II  TM Distance: >3 FB Neck ROM: Full    Dental no notable dental hx.    Pulmonary    Pulmonary exam normal        Cardiovascular hypertension, Pt. on medications and Pt. on home beta blockers + dysrhythmias  Rhythm:Regular Rate:Normal  Echo 12/12/22:   IMPRESSIONS    1. Left ventricular ejection fraction, by estimation, is 55 to 60%. The left ventricle has normal function. The left ventricle has no regional wall motion abnormalities. Left ventricular diastolic parameters are consistent with Grade I diastolic dysfunction (impaired relaxation). The average left ventricular global longitudinal strain is -19.9 %. The global longitudinal strain is normal.  2. Right ventricular systolic function is normal. The right ventricular size is normal. There is normal pulmonary artery systolic pressure.  3. The mitral valve is normal in structure. No evidence of mitral valve regurgitation. No evidence of mitral stenosis.  4. The aortic valve is normal in structure. Aortic valve regurgitation is not visualized. No aortic stenosis is present.  5. The inferior vena cava is normal in size with greater than 50% respiratory variability, suggesting right atrial pressure of 3 mmHg.    Neuro/Psych Seizures - (2016), Well Controlled,    Depression       GI/Hepatic Neg liver ROS,GERD  Medicated,,  Endo/Other  negative endocrine ROS    Renal/GU CRFRenal diseaseRenal cysts  negative genitourinary   Musculoskeletal  (+) Arthritis , Osteoarthritis,    Abdominal Normal abdominal exam  (+)   Peds  Hematology  (+) Blood dyscrasia, anemia  Lab Results      Component                Value               Date                      WBC                      5.0                 06/06/2023                HGB                      13.9                06/06/2023                HCT                      41.8                06/06/2023                MCV                      94.6                06/06/2023                PLT  164                 06/06/2023              Anesthesia Other Findings   Reproductive/Obstetrics                             Anesthesia Physical Anesthesia Plan  ASA: 3  Anesthesia Plan: General   Post-op Pain Management:    Induction: Intravenous  PONV Risk Score and Plan: 3 and Ondansetron, Dexamethasone, Midazolam  and Treatment may vary due to age or medical condition  Airway Management Planned: Mask and Oral ETT  Additional Equipment: None  Intra-op Plan:   Post-operative Plan: Extubation in OR  Informed Consent: I have reviewed the patients History and Physical, chart, labs and discussed the procedure including the risks, benefits and alternatives for the proposed anesthesia with the patient or authorized representative who has indicated his/her understanding and acceptance.     Dental advisory given  Plan Discussed with: CRNA  Anesthesia Plan Comments: (See PAT note from 5/16)        Anesthesia Quick Evaluation

## 2023-06-10 ENCOUNTER — Ambulatory Visit (HOSPITAL_COMMUNITY)
Admission: RE | Admit: 2023-06-10 | Discharge: 2023-06-10 | Disposition: A | Discharge status: Home / Self Care | Source: Ambulatory Visit | Attending: Interventional Radiology | Admitting: Interventional Radiology

## 2023-06-10 ENCOUNTER — Ambulatory Visit (HOSPITAL_COMMUNITY): Payer: Self-pay | Admitting: Certified Registered Nurse Anesthetist

## 2023-06-10 ENCOUNTER — Encounter (HOSPITAL_COMMUNITY)
Admission: RE | Disposition: A | Discharge status: Home / Self Care | Payer: Self-pay | Source: Ambulatory Visit | Attending: Interventional Radiology

## 2023-06-10 ENCOUNTER — Encounter (HOSPITAL_COMMUNITY): Payer: Self-pay | Admitting: Interventional Radiology

## 2023-06-10 ENCOUNTER — Other Ambulatory Visit: Payer: Self-pay | Admitting: Interventional Radiology

## 2023-06-10 ENCOUNTER — Other Ambulatory Visit: Payer: Self-pay

## 2023-06-10 DIAGNOSIS — N2581 Secondary hyperparathyroidism of renal origin: Secondary | ICD-10-CM | POA: Diagnosis not present

## 2023-06-10 DIAGNOSIS — K219 Gastro-esophageal reflux disease without esophagitis: Secondary | ICD-10-CM | POA: Insufficient documentation

## 2023-06-10 DIAGNOSIS — N184 Chronic kidney disease, stage 4 (severe): Secondary | ICD-10-CM | POA: Insufficient documentation

## 2023-06-10 DIAGNOSIS — I129 Hypertensive chronic kidney disease with stage 1 through stage 4 chronic kidney disease, or unspecified chronic kidney disease: Secondary | ICD-10-CM | POA: Insufficient documentation

## 2023-06-10 DIAGNOSIS — Q613 Polycystic kidney, unspecified: Secondary | ICD-10-CM

## 2023-06-10 DIAGNOSIS — Q612 Polycystic kidney, adult type: Secondary | ICD-10-CM | POA: Insufficient documentation

## 2023-06-10 DIAGNOSIS — D631 Anemia in chronic kidney disease: Secondary | ICD-10-CM | POA: Diagnosis not present

## 2023-06-10 DIAGNOSIS — N281 Cyst of kidney, acquired: Secondary | ICD-10-CM

## 2023-06-10 HISTORY — PX: IR US GUIDE TISSUE ABLATION: IMG5113

## 2023-06-10 HISTORY — PX: RADIOLOGY WITH ANESTHESIA: SHX6223

## 2023-06-10 HISTORY — PX: IR FLUORO GUIDED NEEDLE PLC ASPIRATION/INJECTION LOC: IMG2395

## 2023-06-10 LAB — BASIC METABOLIC PANEL WITH GFR
Anion gap: 11 (ref 5–15)
BUN: 32 mg/dL — ABNORMAL HIGH (ref 8–23)
CO2: 18 mmol/L — ABNORMAL LOW (ref 22–32)
Calcium: 8.8 mg/dL — ABNORMAL LOW (ref 8.9–10.3)
Chloride: 111 mmol/L (ref 98–111)
Creatinine, Ser: 1.83 mg/dL — ABNORMAL HIGH (ref 0.44–1.00)
GFR, Estimated: 31 mL/min — ABNORMAL LOW (ref >60)
Glucose, Bld: 125 mg/dL — ABNORMAL HIGH (ref 70–99)
Potassium: 4.7 mmol/L (ref 3.5–5.1)
Sodium: 140 mmol/L (ref 135–145)

## 2023-06-10 SURGERY — RADIOLOGY WITH ANESTHESIA
Anesthesia: General | Laterality: Bilateral

## 2023-06-10 MED ORDER — CHLORHEXIDINE GLUCONATE CLOTH 2 % EX PADS: 6.0000 | MEDICATED_PAD | Freq: Every day | CUTANEOUS | Status: DC

## 2023-06-10 MED ORDER — SODIUM CHLORIDE 0.9 % IV SOLN: INTRAVENOUS | Status: DC

## 2023-06-10 MED ORDER — ONDANSETRON HCL 4 MG/2ML IJ SOLN
INTRAMUSCULAR | Status: DC | PRN
  Administered 2023-06-10: 4 mg via INTRAVENOUS

## 2023-06-10 MED ORDER — MIDAZOLAM HCL 2 MG/2ML IJ SOLN
INTRAMUSCULAR | Status: AC
  Filled 2023-06-10: qty 2

## 2023-06-10 MED ORDER — DROPERIDOL 2.5 MG/ML IJ SOLN
0.6250 mg | Freq: Once | INTRAMUSCULAR | Status: AC | PRN
  Administered 2023-06-10: 0.625 mg via INTRAVENOUS

## 2023-06-10 MED ORDER — LIDOCAINE-EPINEPHRINE 1 %-1:100000 IJ SOLN
INTRAMUSCULAR | Status: AC
  Filled 2023-06-10: qty 1

## 2023-06-10 MED ORDER — SUGAMMADEX SODIUM 200 MG/2ML IV SOLN
INTRAVENOUS | Status: DC | PRN
  Administered 2023-06-10: 200 mg via INTRAVENOUS

## 2023-06-10 MED ORDER — FENTANYL CITRATE (PF) 100 MCG/2ML IJ SOLN
INTRAMUSCULAR | Status: DC | PRN
  Administered 2023-06-10 (×2): 50 ug via INTRAVENOUS

## 2023-06-10 MED ORDER — SODIUM CHLORIDE 0.9 % IV SOLN
2.0000 g | Freq: Once | INTRAVENOUS | Status: DC
  Filled 2023-06-10: qty 20

## 2023-06-10 MED ORDER — DROPERIDOL 2.5 MG/ML IJ SOLN
INTRAMUSCULAR | Status: AC
  Filled 2023-06-10: qty 2

## 2023-06-10 MED ORDER — HYDROMORPHONE HCL 1 MG/ML IJ SOLN
INTRAMUSCULAR | Status: DC | PRN
Start: 2023-06-10 — End: 2023-06-10
  Administered 2023-06-10 (×2): .5 mg via INTRAVENOUS

## 2023-06-10 MED ORDER — CHLORHEXIDINE GLUCONATE 0.12 % MT SOLN
15.0000 mL | Freq: Once | OROMUCOSAL | Status: AC
  Administered 2023-06-10: 15 mL via OROMUCOSAL

## 2023-06-10 MED ORDER — ROCURONIUM BROMIDE 100 MG/10ML IV SOLN
INTRAVENOUS | Status: DC | PRN
  Administered 2023-06-10: 50 mg via INTRAVENOUS
  Administered 2023-06-10: 10 mg via INTRAVENOUS

## 2023-06-10 MED ORDER — FENTANYL CITRATE (PF) 100 MCG/2ML IJ SOLN
INTRAMUSCULAR | Status: AC
Start: 2023-06-10 — End: ?
  Filled 2023-06-10: qty 2

## 2023-06-10 MED ORDER — PHENYLEPHRINE HCL-NACL 20-0.9 MG/250ML-% IV SOLN
INTRAVENOUS | Status: DC | PRN
  Administered 2023-06-10: 30 ug/min via INTRAVENOUS

## 2023-06-10 MED ORDER — LIDOCAINE HCL (CARDIAC) PF 100 MG/5ML IV SOSY
PREFILLED_SYRINGE | INTRAVENOUS | Status: DC | PRN
  Administered 2023-06-10: 80 mg via INTRAVENOUS

## 2023-06-10 MED ORDER — ALCOHOL (ABLYSINOL) 99% IA SOLN
INTRA_ARTERIAL | Status: AC | PRN
  Administered 2023-06-10: 3 mL
  Administered 2023-06-10: 2 mL
  Administered 2023-06-10 (×2): 3 mL
  Administered 2023-06-10 (×3): 2 mL

## 2023-06-10 MED ORDER — PROMETHAZINE HCL 12.5 MG PO TABS: 12.5000 mg | ORAL_TABLET | Freq: Four times a day (QID) | ORAL | 0 refills | Status: AC | PRN

## 2023-06-10 MED ORDER — LACTATED RINGERS IV SOLN: INTRAVENOUS | Status: DC

## 2023-06-10 MED ORDER — PROPOFOL 10 MG/ML IV BOLUS
INTRAVENOUS | Status: DC | PRN
  Administered 2023-06-10: 150 mg via INTRAVENOUS

## 2023-06-10 MED ORDER — IOHEXOL 300 MG/ML  SOLN
50.0000 mL | Freq: Once | INTRAMUSCULAR | Status: AC | PRN
  Administered 2023-06-10: 20 mL

## 2023-06-10 MED ORDER — ORAL CARE MOUTH RINSE: 15.0000 mL | Freq: Once | OROMUCOSAL | Status: AC

## 2023-06-10 MED ORDER — DEXAMETHASONE SODIUM PHOSPHATE 10 MG/ML IJ SOLN
INTRAMUSCULAR | Status: DC | PRN
  Administered 2023-06-10: 10 mg via INTRAVENOUS

## 2023-06-10 MED ORDER — EPHEDRINE SULFATE (PRESSORS) 50 MG/ML IJ SOLN
INTRAMUSCULAR | Status: DC | PRN
  Administered 2023-06-10: 5 mg via INTRAVENOUS

## 2023-06-10 MED ORDER — FENTANYL CITRATE PF 50 MCG/ML IJ SOSY: 25.0000 ug | PREFILLED_SYRINGE | INTRAMUSCULAR | Status: DC | PRN

## 2023-06-10 MED ORDER — OXYCODONE HCL 5 MG PO TABS: 5.0000 mg | ORAL_TABLET | Freq: Four times a day (QID) | ORAL | 0 refills | Status: AC | PRN

## 2023-06-10 MED ORDER — HYDROMORPHONE HCL 1 MG/ML IJ SOLN
INTRAMUSCULAR | Status: AC
  Filled 2023-06-10: qty 1

## 2023-06-10 MED ORDER — PHENYLEPHRINE HCL (PRESSORS) 10 MG/ML IV SOLN
INTRAVENOUS | Status: DC | PRN
  Administered 2023-06-10: 80 ug via INTRAVENOUS

## 2023-06-10 MED ORDER — MIDAZOLAM HCL 5 MG/5ML IJ SOLN
INTRAMUSCULAR | Status: DC | PRN
  Administered 2023-06-10: 2 mg via INTRAVENOUS

## 2023-06-10 MED ORDER — DEXTROSE 5 % IV SOLN
INTRAVENOUS | Status: DC | PRN
  Administered 2023-06-10: 2 g via INTRAVENOUS

## 2023-06-10 NOTE — Sedation Documentation (Signed)
 Patient transported to PACU with this RN and CRNA. Bedside report given to Barbie, RN.

## 2023-06-10 NOTE — H&P (Deleted)
   The note originally documented on this encounter has been moved the the encounter in which it belongs.

## 2023-06-10 NOTE — H&P (Signed)
 Chief Complaint: Patient was seen in consultation today for polycystic kidney disease, with consideration for definitive treatment with sclerotherapy.  Referring Provider(s): Established patient   Supervising Physician: Creasie Doctor  Patient Status: [facility] - Out-pt  Patient is Full Code  History of Present Illness: Debbie Ward is a 63 y.o. female with PMHx notable for HTN, depression, anemia, chronic kidney disease and polycystic kidney disease.  Per Dr. Aleen Huron progress note on 3/28: "[Patient] is followed by [facility] and she is familiar to IR from multiple bilateral renal cyst aspirations. Her last procedure was 04/02/23 and I aspirated 500 ml from the left kidney and 200 ml from the right kidney. She presents [...] to discuss a definitive plan for sclerotherapy.   [...] At the time of her last cyst aspiration, we discussed the possibility of performing ethanol sclerotherapy to her cysts to permanently shrink them and prevent refilling.  She would still like to proceed with this.  Given her discomfort during her prior aspirations, the ablation procedure will require general anesthesia.  We discussed that this may take several sessions over several years to get her to the comfort level she desires."  Patient is scheduled for ultrasound guided bilateral renal cyst aspiration and ethanol ablation in Interventional Radiology with General Anesthesia today.   Patient is alert and laying in bed, calm.  Patient is currently without any significant complaints. She does have baseline chronic back pain. Patient denies any fevers, headache, chest pain, SOB, cough, abdominal pain, nausea, vomiting or bleeding.     Past Medical History:  Diagnosis Date   Anemia secondary to renal failure    Arthritis    Cardiomegaly    CKD (chronic kidney disease), stage IV (HCC)    Complication of anesthesia    hard to wake up   Depression 12/01/2013   Diastasis recti 05/01/2016    Dyspareunia due to non-psychogenic cause in female 09/19/2018   Dyspnea    Dysrhythmia    Bradycardia   GERD (gastroesophageal reflux disease)    Heartburn 05/01/2016   Hyperlipemia    Hypertension    Hypertension, renal disease    Meningioma (HCC)    Multiple renal cysts    Nausea    PCK (polycystic kidney disease)    Pneumonia    Polycystic renal disease    Secondary hyperparathyroidism (HCC)    Seizure (HCC) 04/20/2014   Stage 3b chronic kidney disease (CKD) (HCC)    Transient alteration of awareness 12/01/2013    Past Surgical History:  Procedure Laterality Date   ABDOMINAL HYSTERECTOMY     partial   APPENDECTOMY     CHOLECYSTECTOMY     laparoscopic   IR RADIOLOGIST EVAL & MGMT  03/08/2021   IR RADIOLOGIST EVAL & MGMT  04/18/2023   WISDOM TOOTH EXTRACTION      Allergies: Percocet [oxycodone-acetaminophen ]  Medications: Prior to Admission medications   Medication Sig Start Date End Date Taking? Authorizing Provider  acetaminophen  (TYLENOL ) 650 MG CR tablet Take 1,300 mg by mouth every 8 (eight) hours as needed for pain.    [provider]  albuterol (VENTOLIN HFA) 108 (90 Base) MCG/ACT inhaler Inhale 1-2 puffs into the lungs every 6 (six) hours as needed for wheezing or shortness of breath.    [provider]  amLODipine (NORVASC) 5 MG tablet Take 5 mg by mouth daily.    [provider]  cyanocobalamin (VITAMIN B12) 1000 MCG tablet Take 1,000 mcg by mouth daily.  [provider]  JYNARQUE 15 MG TABS tablet Take 15 mg by mouth 2 (two) times daily. 12/04/22   [provider]  lisinopril (ZESTRIL) 40 MG tablet Take 40 mg by mouth daily.    [provider]  omeprazole (PRILOSEC OTC) 20 MG tablet Take 20 mg by mouth daily.    [provider]  ondansetron (ZOFRAN-ODT) 4 MG disintegrating tablet Take 4 mg by mouth every 8 (eight) hours as needed for nausea or vomiting.    [provider]   Semaglutide-Weight Management (WEGOVY) 0.25 MG/0.5ML SOAJ Inject 0.25 mg into the skin once a week.    [provider]     Family History  Problem Relation Age of Onset   Polycystic kidney disease Mother    Cancer - Lung Father     Social History   Socioeconomic History   Marital status: Married    Spouse name: Not on file   Number of children: Not on file   Years of education: Not on file   Highest education level: Not on file  Occupational History   Not on file  Tobacco Use   Smoking status: Never   Smokeless tobacco: Never  Vaping Use   Vaping status: Never Used  Substance and Sexual Activity   Alcohol use: No   Drug use: No   Sexual activity: Yes  Other Topics Concern   Not on file  Social History Narrative   Not on file   Social Drivers of Health   Financial Resource Strain: Medium Risk (01/29/2023)   Received from [facility]   Overall Financial Resource Strain (CARDIA)    Difficulty of Paying Living Expenses: Somewhat hard  Food Insecurity: No Food Insecurity (01/29/2023)   Received from [facility]   Hunger Vital Sign    Worried About Running Out of Food in the Last Year: Never true    Ran Out of Food in the Last Year: Never true  Transportation Needs: No Transportation Needs (01/29/2023)   Received from [facility] - Transportation    Lack of Transportation (Medical): No    Lack of Transportation (Non-Medical): No  Physical Activity: Insufficiently Active (08/13/2022)   Received from [facility]   Exercise Vital Sign    Days of Exercise per Week: 2 days    Minutes of Exercise per Session: 30 min  Stress: No Stress Concern Present (08/13/2022)   Received from [facility] - Occupational Stress Questionnaire    Feeling of Stress : Only a little  Social Connections: Socially Integrated (08/13/2022)   Received from [facility]   Social Network    How would you rate your social network  (family, work, friends)?: Good participation with social networks     Review of Systems: A 12 point ROS discussed and pertinent positives are indicated in the HPI above.  All other systems are negative.  Vital Signs: There were no vitals taken for this visit.  Advance Care Plan: The advanced care place/surrogate decision maker was discussed at the time of visit and the patient did not wish to discuss or was not able to name a surrogate decision maker or provide an advance care plan.  Physical Exam  Imaging: DG Chest 1 View Result Date: 06/06/2023 CLINICAL DATA:  Preop exam.  Polycystic kidney disease. EXAM: CHEST  1 VIEW COMPARISON:  Radiograph 10/01/2017 FINDINGS: The cardiomediastinal contours are normal. The lungs are clear. Pulmonary vasculature is normal. No consolidation,  pleural effusion, or pneumothorax. No acute osseous abnormalities are seen. IMPRESSION: No active disease. Electronically Signed   By: Chadwick Colonel M.D.   On: 06/06/2023 12:37    Labs:  CBC: Recent Labs    06/24/22 0709 04/02/23 0636 06/06/23 1030  WBC 3.8* 10.0 5.0  HGB 13.6 13.7 13.9  HCT 40.0 40.1 41.8  PLT 120* 203 164    COAGS: Recent Labs    06/24/22 0709 04/02/23 0636 06/06/23 1030  INR 1.0 1.0 1.0    BMP: Recent Labs    06/06/23 1030  NA 138  K 4.0  CL 109  CO2 20*  GLUCOSE 109*  BUN 34*  CALCIUM 8.9  CREATININE 2.18*  GFRNONAA 25*    LIVER FUNCTION TESTS: No results for input(s): "BILITOT", "AST", "ALT", "ALKPHOS", "PROT", "ALBUMIN" in the last 8760 hours.  TUMOR MARKERS: No results for input(s): "AFPTM", "CEA", "CA199", "CHROMGRNA" in the last 8760 hours.  Assessment and Plan: Per Dr. Aleen Huron progress note on 3/28: "Plan for ultrasound guided bilateral renal cyst aspiration and ethanol ablation in Interventional Radiology with General Anesthesia at [facility].  Plan for antibiotic prophylaxis and same day discharge home."  Patient presents for scheduled  bilateral renal cyst ablation in IR today.  Patient has been NPO since midnight.  All labs and medications are within acceptable parameters.  No pertinent allergies.   Risks and benefits discussed with the patient including, but not limited to bleeding, infection, kidney injury, kidney failure, duct injury, or damage to adjacent structures.  All of the patient's questions were answered, patient is agreeable to proceed. Consent signed and in chart.     Thank you for allowing our service to participate in Arvella Massingale 's care.  Electronically Signed: Lovena Rubinstein, PA-C   06/09/2023, 7:25 PM      I spent a total of 25 Minutes in face to face in clinical consultation, greater than 50% of which was counseling/coordinating care for polycystic kidney disease, with consideration for definitive treatment with sclerotherapy.

## 2023-06-10 NOTE — Anesthesia Procedure Notes (Signed)
 Procedure Name: Intubation Date/Time: 06/10/2023 9:39 AM  Performed by: Judd Northern, RNPre-anesthesia Checklist: Patient identified, Emergency Drugs available, Suction available, Timeout performed and Patient being monitored Patient Re-evaluated:Patient Re-evaluated prior to induction Oxygen Delivery Method: Simple face mask Preoxygenation: Pre-oxygenation with 100% oxygen Induction Type: IV induction Ventilation: Mask ventilation without difficulty Laryngoscope Size: Mac and 3 Grade View: Grade I Tube type: Oral Tube size: 7.0 mm Number of attempts: 1 Airway Equipment and Method: Stylet Placement Confirmation: ETT inserted through vocal cords under direct vision, positive ETCO2 and breath sounds checked- equal and bilateral Secured at: 22 cm Tube secured with: Tape Dental Injury: Teeth and Oropharynx as per pre-operative assessment

## 2023-06-10 NOTE — Procedures (Signed)
 Interventional Radiology Procedure Note  Procedure:  1) Image guided right renal cyst aspiration and sclerotherapy 2) Image guided left renal cyst aspiration and sclerotherapy  Findings: Please refer to procedural dictation for full description. Approximately 900 mL cyst fluid aspirated from left kidney, 500 mL from right.  10 mL dehydrated ethanol administered into multiple cysts bilaterally for a total of 20 mL.  Complications: None immediate  Estimated Blood Loss: < 5 mL  Recommendations: IR will arrange 4-6 week clinic follow up.   Creasie Doctor, MD

## 2023-06-10 NOTE — Transfer of Care (Signed)
 Immediate Anesthesia Transfer of Care Note  Patient: Debbie Ward  Procedure(s) Performed: RADIOLOGY WITH ANESTHESIA (Bilateral)  Patient Location: PACU  Anesthesia Type:General  Level of Consciousness: awake  Airway & Oxygen Therapy: Patient Spontanous Breathing and Patient connected to face mask oxygen  Post-op Assessment: Report given to RN and Post -op Vital signs reviewed and stable  Post vital signs: Reviewed and stable  Last Vitals:  Vitals Value Taken Time  BP 123/58 06/10/23 1130  Temp    Pulse 60 06/10/23 1132  Resp 13 06/10/23 1132  SpO2 98 % 06/10/23 1132  Vitals shown include unfiled device data.  Last Pain:  Vitals:   06/10/23 0658  TempSrc:   PainSc: 0-No pain         Complications: No notable events documented.

## 2023-06-11 ENCOUNTER — Encounter (HOSPITAL_COMMUNITY): Payer: Self-pay | Admitting: Radiology

## 2023-06-11 NOTE — Anesthesia Postprocedure Evaluation (Signed)
 Anesthesia Post Note  Patient: Debbie Ward  Procedure(s) Performed: RADIOLOGY WITH ANESTHESIA (Bilateral)     Patient location during evaluation: PACU Anesthesia Type: General Level of consciousness: awake and alert Pain management: pain level controlled Vital Signs Assessment: post-procedure vital signs reviewed and stable Respiratory status: spontaneous breathing, nonlabored ventilation, respiratory function stable and patient connected to nasal cannula oxygen Cardiovascular status: blood pressure returned to baseline and stable Postop Assessment: no apparent nausea or vomiting Anesthetic complications: no   No notable events documented.  Last Vitals:  Vitals:   06/10/23 1445 06/10/23 1500  BP: 113/66 118/71  Pulse: (!) 50 (!) 54  Resp:    Temp:    SpO2: 92% 94%    Last Pain:  Vitals:   06/10/23 1500  TempSrc:   PainSc: 0-No pain                 Theotis Flake P [name]

## 2023-07-01 ENCOUNTER — Other Ambulatory Visit: Payer: Self-pay | Admitting: Interventional Radiology

## 2023-07-01 DIAGNOSIS — N281 Cyst of kidney, acquired: Secondary | ICD-10-CM

## 2023-07-15 NOTE — Progress Notes (Signed)
 Referring Physician(s): Established patient  Chief Complaint: The patient is seen in follow up today s/p bilateral renal cyst aspiration with ethanol ablation 06/10/23  History of present illness: HPI from last clinic visit 04/18/23 Debbie Ward, 63 year old female, has a medical history significant for HTN, depression, anemia, chronic kidney disease and polycystic kidney disease. She is followed by [facility] and she is familiar to IR from multiple bilateral renal cyst aspirations. Her last procedure was 04/02/23 and I aspirated 500 ml from the left kidney and 200 ml from the right kidney. She presents today in virtual video consultation to discuss a definitive plan for sclerotherapy.    She reports experiencing significant relief after the most recent aspiration.  No hematuria or UTI.  She would like to proceed with sclerotherapy in the future.  We discussed performing ethanol sclerotherapy under general anesthesia given her discomfort during her prior aspirations. I also explained that this may take several sessions over several years to get her to the comfort level she desires. She was agreeable to proceed. On 06/10/23 I aspirated a total of 900 ml of fluid from multiple cysts in the left kidney. From the right kidney a total of 400 ml was aspirated. Each kidney was subsequently treated with 10 ml of ethanol (20 ml total).  She tolerated the procedure well and was discharged home the same day. She presents today for follow up.   Past Medical History:  Diagnosis Date   Anemia secondary to renal failure    Arthritis    Cardiomegaly    CKD (chronic kidney disease), stage IV (HCC)    Complication of anesthesia    hard to wake up   Depression 12/01/2013   Diastasis recti 05/01/2016   Dyspareunia due to non-psychogenic cause in female 09/19/2018   Dyspnea    Dysrhythmia    Bradycardia   GERD (gastroesophageal reflux disease)    Heartburn 05/01/2016   Hyperlipemia     Hypertension    Hypertension, renal disease    Meningioma (HCC)    Multiple renal cysts    Nausea    PCK (polycystic kidney disease)    Pneumonia    Polycystic renal disease    Secondary hyperparathyroidism (HCC)    Seizure (HCC) 04/20/2014   Stage 3b chronic kidney disease (CKD) (HCC)    Transient alteration of awareness 12/01/2013    Past Surgical History:  Procedure Laterality Date   ABDOMINAL HYSTERECTOMY     partial   APPENDECTOMY     CHOLECYSTECTOMY     laparoscopic   IR FLUORO GUIDED NEEDLE PLC ASPIRATION/INJECTION LOC  06/10/2023   IR FLUORO GUIDED NEEDLE PLC ASPIRATION/INJECTION LOC  06/10/2023   IR RADIOLOGIST EVAL & MGMT  03/08/2021   IR RADIOLOGIST EVAL & MGMT  04/18/2023   IR US  GUIDE TISSUE ABLATION  06/10/2023   IR US  GUIDE TISSUE ABLATION  06/10/2023   RADIOLOGY WITH ANESTHESIA Bilateral 06/10/2023   Procedure: RADIOLOGY WITH ANESTHESIA;  Surgeon: Radiologist, Medication, MD;  Location: [facility];  Service: Radiology;  Laterality: Bilateral;   WISDOM TOOTH EXTRACTION      Allergies: Percocet [oxycodone -acetaminophen ]  Medications: Prior to Admission medications   Medication Sig Start Date End Date Taking? Authorizing Provider  acetaminophen  (TYLENOL ) 650 MG CR tablet Take 1,300 mg by mouth every 8 (eight) hours as needed for pain.    [provider]  albuterol (VENTOLIN HFA) 108 (90 Base) MCG/ACT inhaler Inhale 1-2 puffs into the lungs every 6 (six) hours as needed  for wheezing or shortness of breath.    [provider]  amLODipine (NORVASC) 5 MG tablet Take 5 mg by mouth daily.    [provider]  cyanocobalamin (VITAMIN B12) 1000 MCG tablet Take 1,000 mcg by mouth daily.    [provider]  JYNARQUE 15 MG TABS tablet Take 15 mg by mouth 2 (two) times daily. 12/04/22   [provider]  lisinopril (ZESTRIL) 40 MG tablet Take 40 mg by mouth daily.    [provider]  omeprazole (PRILOSEC OTC) 20 MG tablet Take 20  mg by mouth daily.    [provider]  ondansetron  (ZOFRAN -ODT) 4 MG disintegrating tablet Take 4 mg by mouth every 8 (eight) hours as needed for nausea or vomiting.    [provider]  oxyCODONE  (ROXICODONE ) 5 MG immediate release tablet Take 1 tablet (5 mg total) by mouth every 6 (six) hours as needed for severe pain (pain score 7-10). 06/10/23   Carim, Charles A, PA-C  promethazine  (PHENERGAN ) 12.5 MG tablet Take 1 tablet (12.5 mg total) by mouth every 6 (six) hours as needed for nausea or vomiting. 06/10/23   Carim, Carlin LABOR, PA-C  Semaglutide-Weight Management (WEGOVY) 0.25 MG/0.5ML SOAJ Inject 0.25 mg into the skin once a week.    [provider]     Family History  Problem Relation Age of Onset   Polycystic kidney disease Mother    Cancer - Lung Father     Social History   Socioeconomic History   Marital status: Married    Spouse name: Not on file   Number of children: Not on file   Years of education: Not on file   Highest education level: Not on file  Occupational History   Not on file  Tobacco Use   Smoking status: Never   Smokeless tobacco: Never  Vaping Use   Vaping status: Never Used  Substance and Sexual Activity   Alcohol  use: No   Drug use: No   Sexual activity: Yes  Other Topics Concern   Not on file  Social History Narrative   Not on file   Social Drivers of Health   Financial Resource Strain: Medium Risk (01/29/2023)   Received from [facility]   Overall Financial Resource Strain (CARDIA)    Difficulty of Paying Living Expenses: Somewhat hard  Food Insecurity: No Food Insecurity (01/29/2023)   Received from [facility]   Hunger Vital Sign    Within the past 12 months, you worried that your food would run out before you got the money to buy more.: Never true    Within the past 12 months, the food you bought just didn't last and you didn't have money to get more.: Never true  Transportation Needs: No Transportation Needs  (01/29/2023)   Received from [facility] - Transportation    Lack of Transportation (Medical): No    Lack of Transportation (Non-Medical): No  Physical Activity: Insufficiently Active (08/13/2022)   Received from [facility]   Exercise Vital Sign    On average, how many days per week do you engage in moderate to strenuous exercise (like a brisk walk)?: 2 days    On average, how many minutes do you engage in exercise at this level?: 30 min  Stress: No Stress Concern Present (08/13/2022)   Received from [facility] - Occupational Stress Questionnaire    Feeling of Stress : Only a little  Social  Connections: Socially Integrated (08/13/2022)   Received from [facility]   Social Network    How would you rate your social network (family, work, friends)?: Good participation with social networks     Vital Signs: There were no vitals taken for this visit.  Physical Exam  Imaging: CT AP 12/15/22     Labs:  CBC: Recent Labs    04/02/23 0636 06/06/23 1030  WBC 10.0 5.0  HGB 13.7 13.9  HCT 40.1 41.8  PLT 203 164    COAGS: Recent Labs    04/02/23 0636 06/06/23 1030  INR 1.0 1.0    BMP: Recent Labs    06/06/23 1030 06/10/23 1151  NA 138 140  K 4.0 4.7  CL 109 111  CO2 20* 18*  GLUCOSE 109* 125*  BUN 34* 32*  CALCIUM 8.9 8.8*  CREATININE 2.18* 1.83*  GFRNONAA 25* 31*    LIVER FUNCTION TESTS: No results for input(s): BILITOT, AST, ALT, ALKPHOS, PROT, ALBUMIN in the last 8760 hours.  Assessment and Plan:  63 year old female with a history of recurrent bilateral renal cysts who has undergone multiple aspirations. She is now s/p aspiration and ethanol ablation 06/10/23.    Ester Sides, MD Pager: [phone]    I spent a total of 25 Minutes in face to face in clinical consultation, greater than 50% of which was counseling/coordinating care for recurrent bilateral renal cysts.

## 2023-07-16 ENCOUNTER — Ambulatory Visit
Admission: RE | Admit: 2023-07-16 | Discharge: 2023-07-16 | Disposition: A | Discharge status: Home / Self Care | Source: Ambulatory Visit | Attending: Interventional Radiology

## 2023-07-16 DIAGNOSIS — N281 Cyst of kidney, acquired: Secondary | ICD-10-CM

## 2023-07-16 HISTORY — PX: IR RADIOLOGIST EVAL & MGMT: IMG5224

## 2023-12-08 ENCOUNTER — Other Ambulatory Visit (HOSPITAL_COMMUNITY): Payer: Self-pay | Admitting: Nephrology

## 2023-12-08 DIAGNOSIS — Q613 Polycystic kidney, unspecified: Secondary | ICD-10-CM

## 2023-12-11 ENCOUNTER — Ambulatory Visit (INDEPENDENT_AMBULATORY_CARE_PROVIDER_SITE_OTHER)
Admission: RE | Admit: 2023-12-11 | Discharge: 2023-12-11 | Disposition: A | Discharge status: Home / Self Care | Source: Ambulatory Visit | Attending: Nephrology | Admitting: Nephrology

## 2023-12-11 DIAGNOSIS — Q613 Polycystic kidney, unspecified: Secondary | ICD-10-CM

## 2024-02-10 IMAGING — US US FINE NEEDLE ASP 1ST LESION
1 series · 10 of 10 positions shown · non-contrast
Comparison: none

INDICATION: Polycystic kidney disease status post multiple ultrasound-guided
renal cyst aspiration for pain control.

[Series 1: us fine needle asp 1st lesion · 10 of 10 slices shown]
[im 1/10]
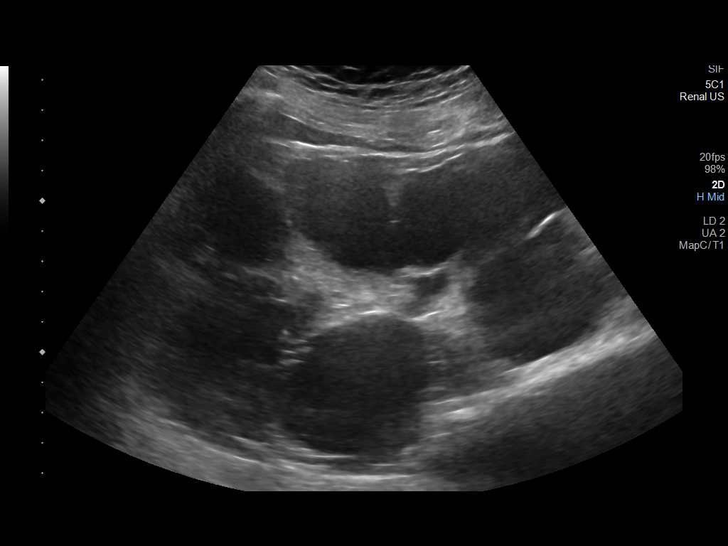
[im 2/10]
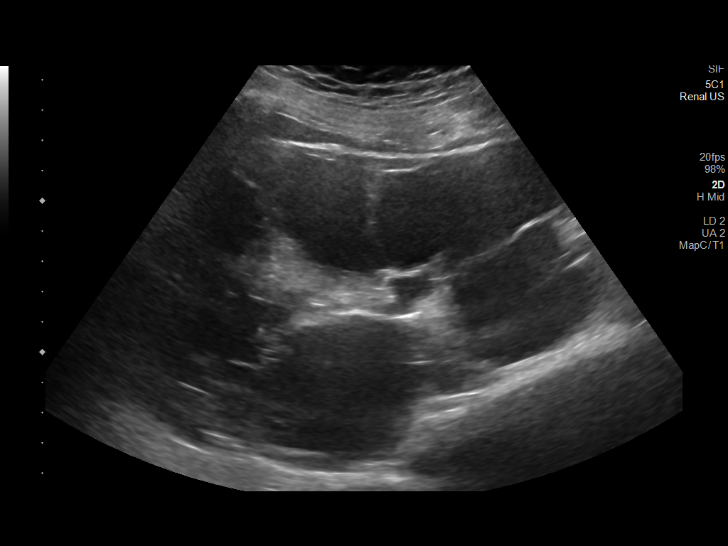
[im 3/10]
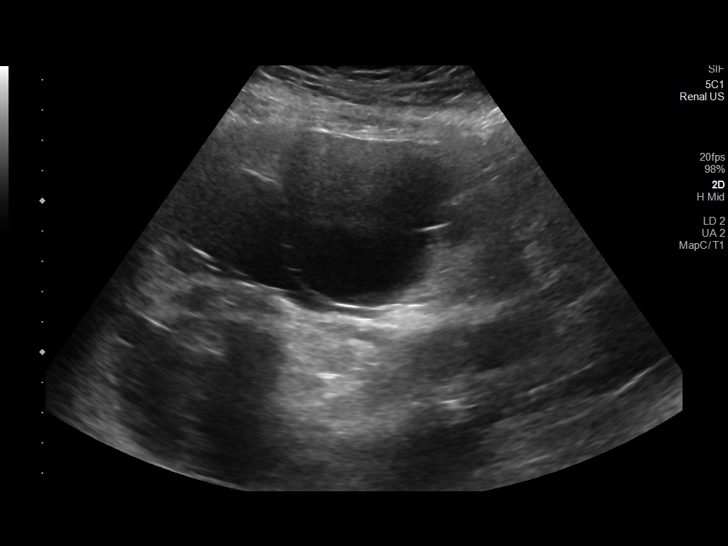
[im 4/10]
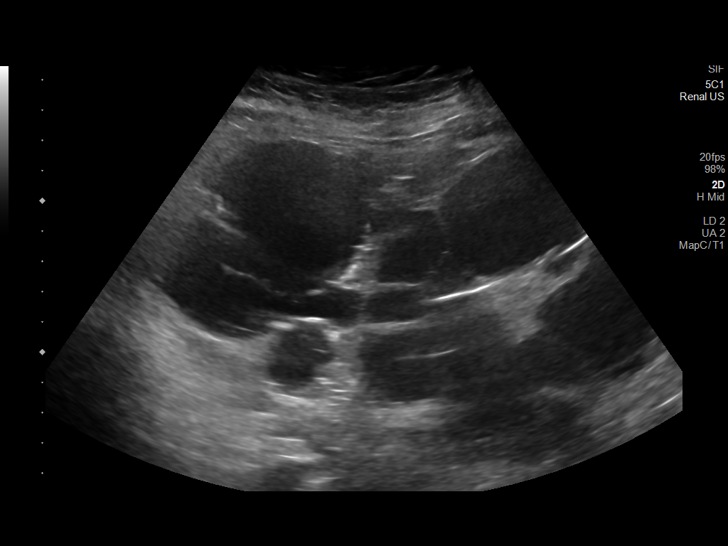
[im 5/10]
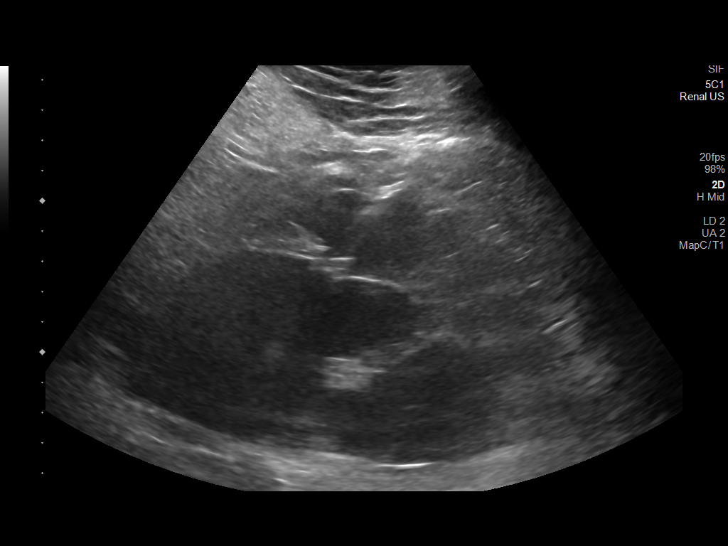
[im 6/10]
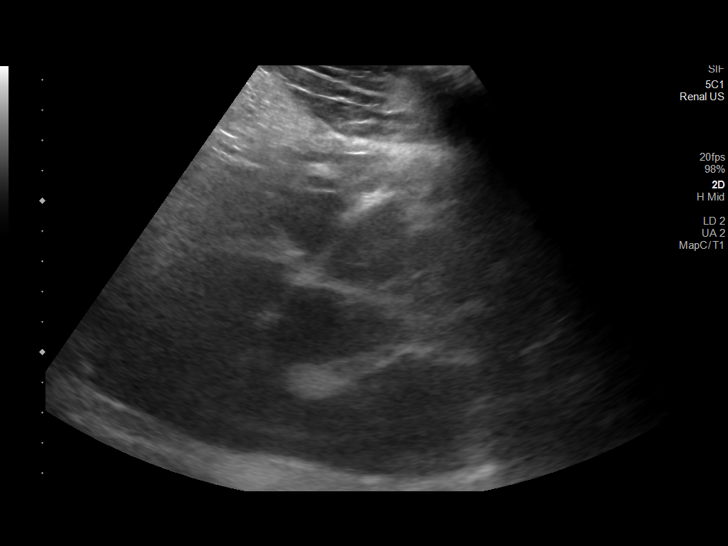
[im 7/10]
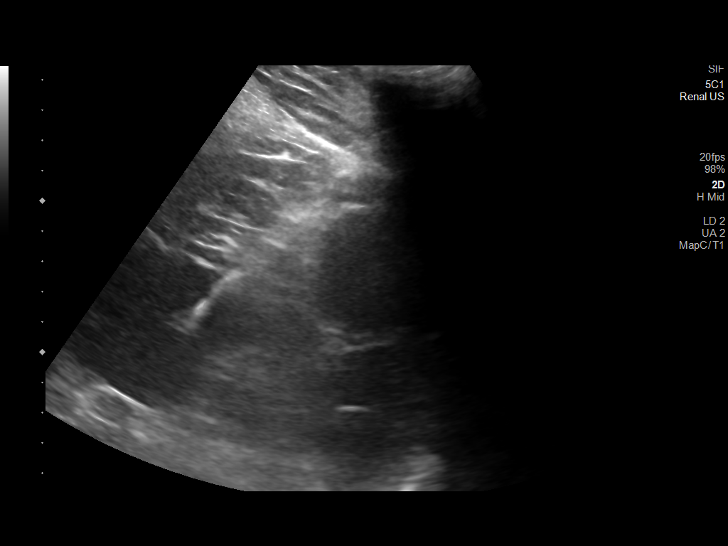
[im 8/10]
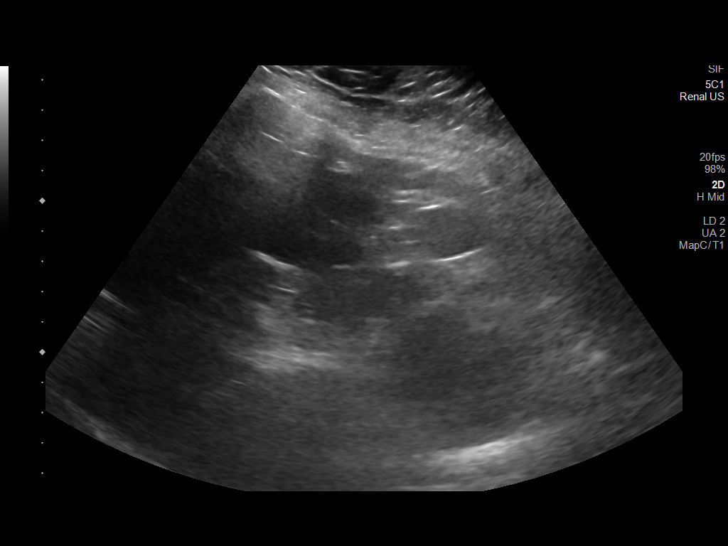
[im 9/10]
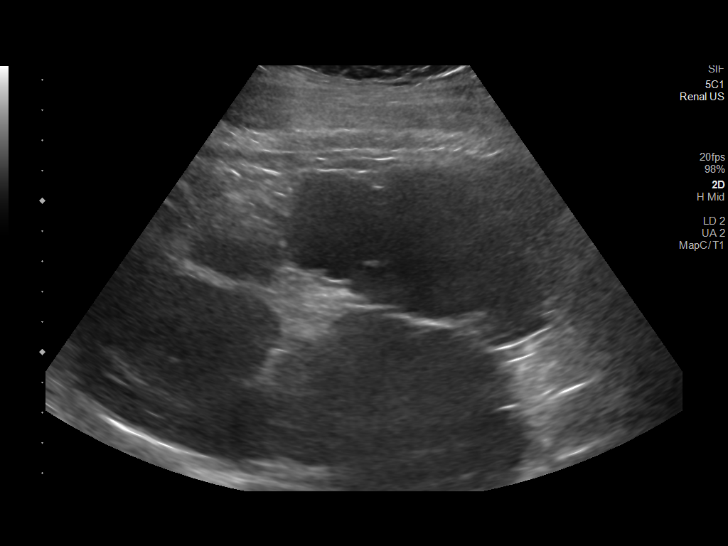
[im 10/10]
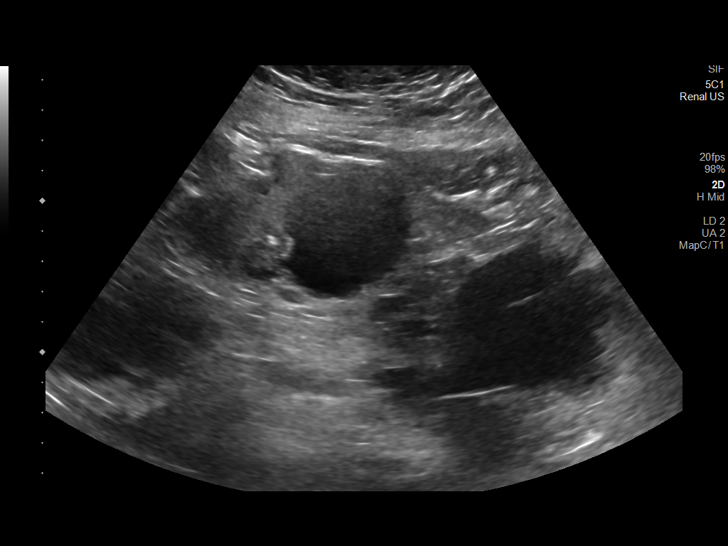

[10 of 10 positions shown; findings below may reference images not displayed]

EXAM:
ULTRASOUND-GUIDED RIGHT RENAL CYST ASPIRATION

ULTRASOUND-GUIDED LEFT RENAL CYST ASPIRATION

MEDICATIONS:
Lidocaine 1% subcutaneous

ANESTHESIA/SEDATION:
Intravenous Fentanyl 530mcg and Versed 2.5mg were administered as
conscious sedation during continuous monitoring of the patient's
level of consciousness and physiological / cardiorespiratory status
by the radiology RN, with a total moderate sedation time of 37
minutes.

FLUOROSCOPY:
None required

COMPLICATIONS:
None immediate.

PROCEDURE:
Informed written consent was obtained from the patient after a
thorough discussion of the procedural risks, benefits and
alternatives. All questions were addressed. Maximal Sterile Barrier
Technique was utilized including caps, mask, sterile gowns, sterile
gloves, sterile drape, hand hygiene and skin antiseptic. A timeout
was performed prior to the initiation of the procedure.

Survey ultrasound of the kidneys obtained, and appropriate skin
entry site determined and marked. Skin prepped in a large field
using chlorhexidine. Local anesthesia was achieved with 1% lidocaine
subcutaneous.

Under real-time ultrasound guidance, 18 gauge trocar needle advanced
into dominant superficial right lower pole renal cysts.
Approximately 400 mL clear yellow fluid were aspirated.

Under real-time ultrasound guidance, a new sterile 18 gauge trocar
needle was advanced into dominant superficial left lower pole and
lateral renal cysts. A total of 450 mL clear yellow fluid aspirated.

Postprocedure scans show no hemorrhage or other apparent
complication. The patient tolerated the procedure well.
IMPRESSION: 1. Technically successful ultrasound-guided right renal cyst
aspiration removing 400 mL fluid.
2. Technically successful ultrasound-guided left renal cyst
aspiration removing 450 mL fluid.

## 2024-02-12 IMAGING — US US RENAL
1 series · 14 of 25 positions shown · non-contrast
Comparison: CT abdomen pelvis 10/20/2014

CLINICAL DATA: PCK (polycystic kidney disease)

EXAM:
RENAL / URINARY TRACT ULTRASOUND COMPLETE

[Series 1: us renal · 0.34mm/px · 14 of 53 slices shown]
[im 1/53]
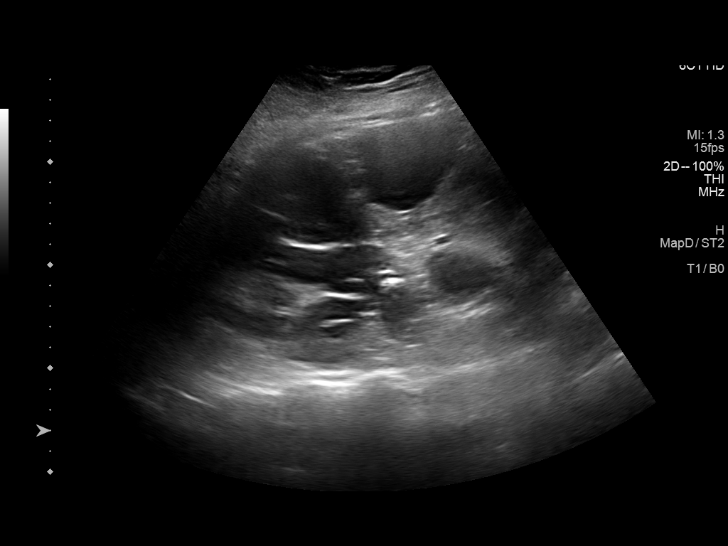
[im 5/53]
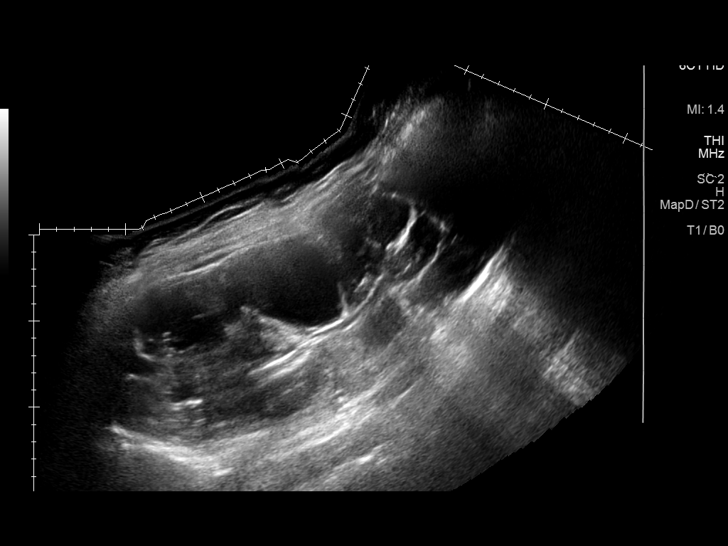
[im 9/53]
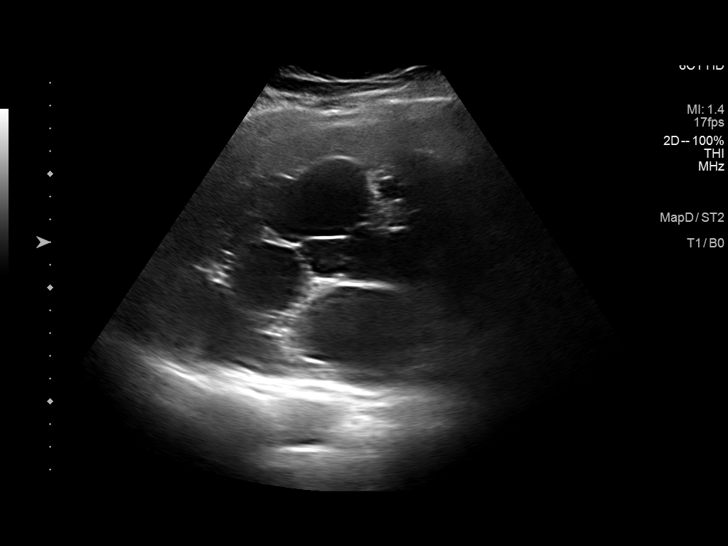
[im 14/53]
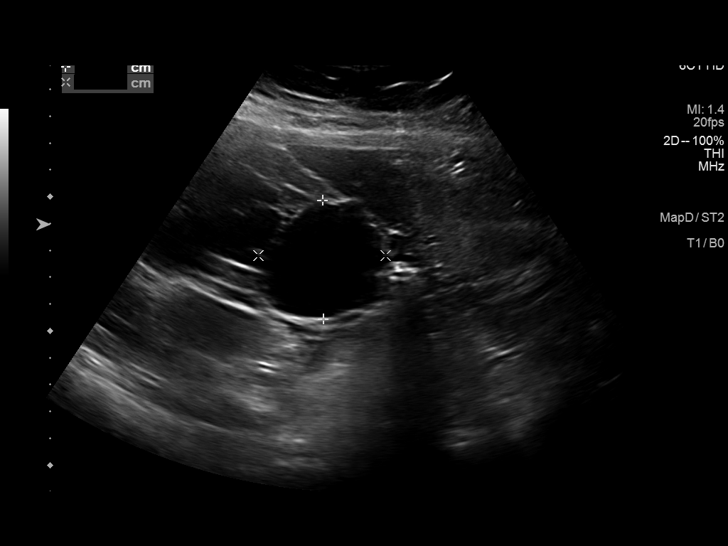
[im 18/53]
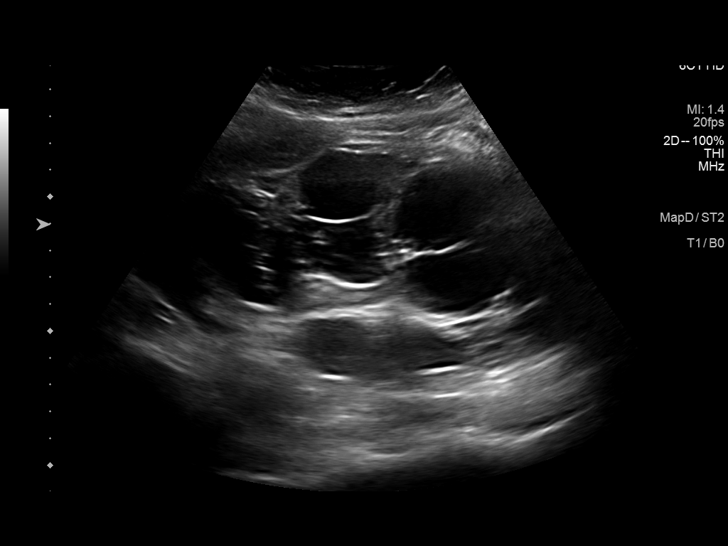
[im 20/53]
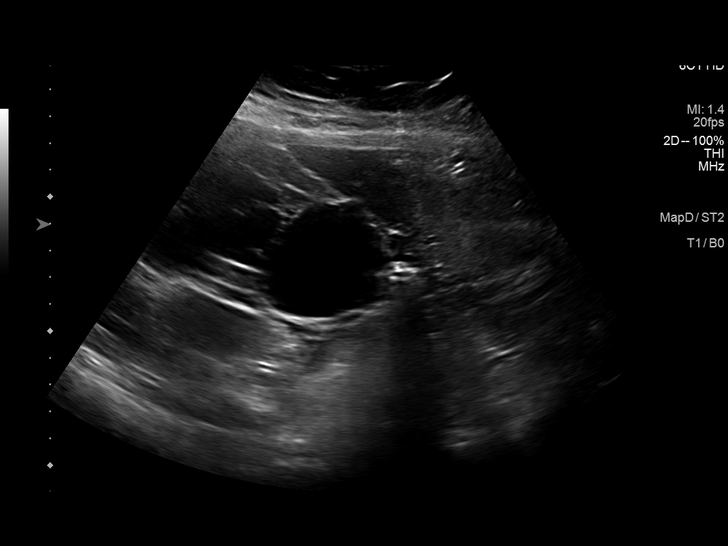
[im 24/53]
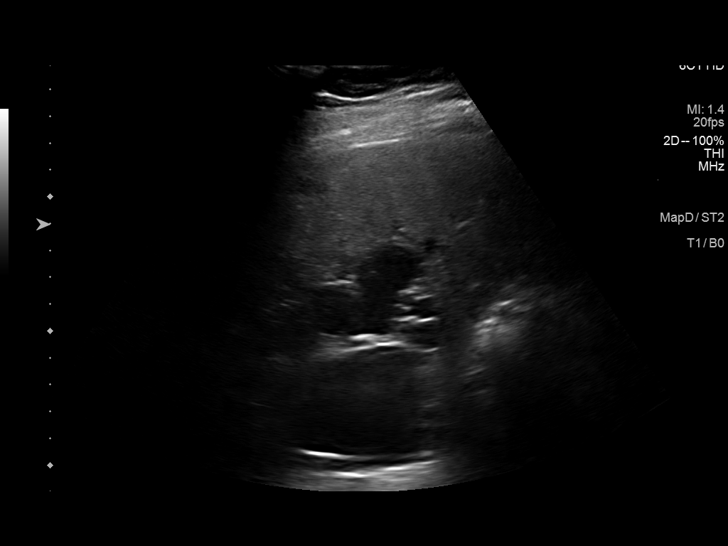
[im 29/53]
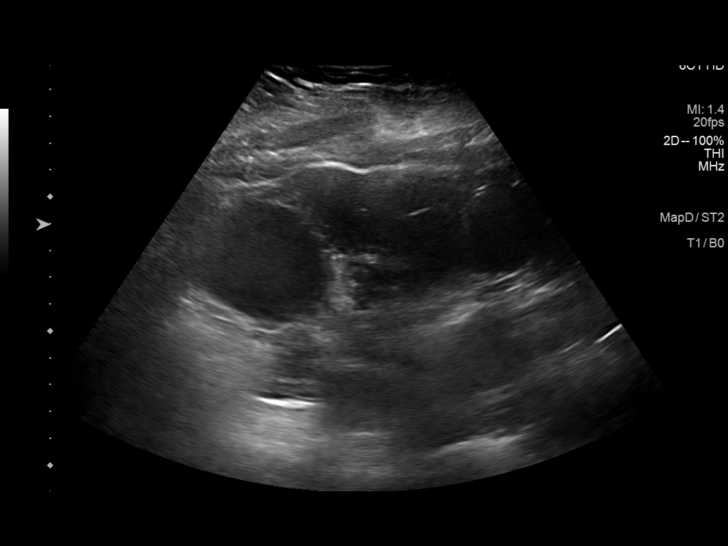
[im 33/53]
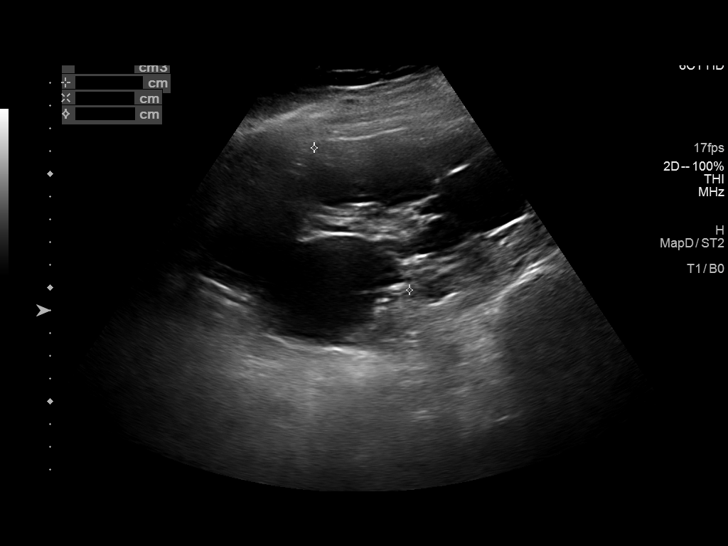
[im 35/53]
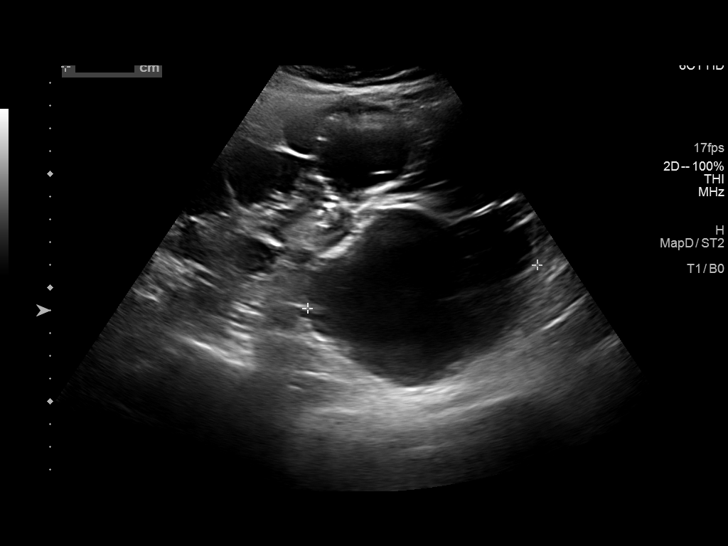
[im 40/53]
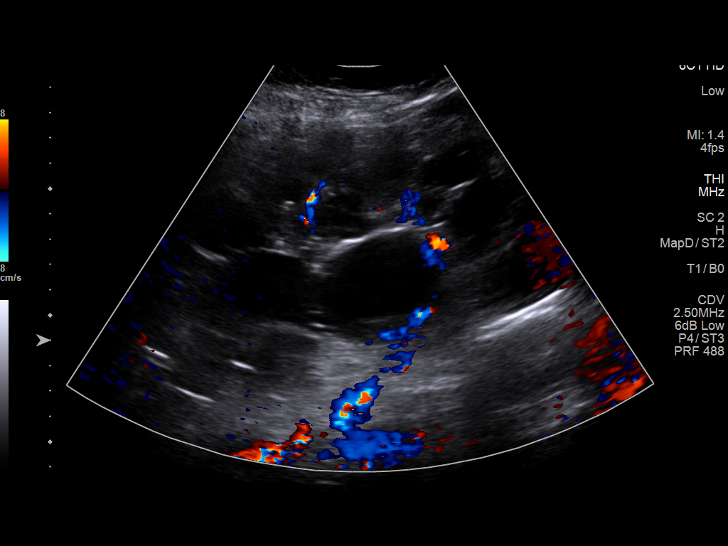
[im 44/53]
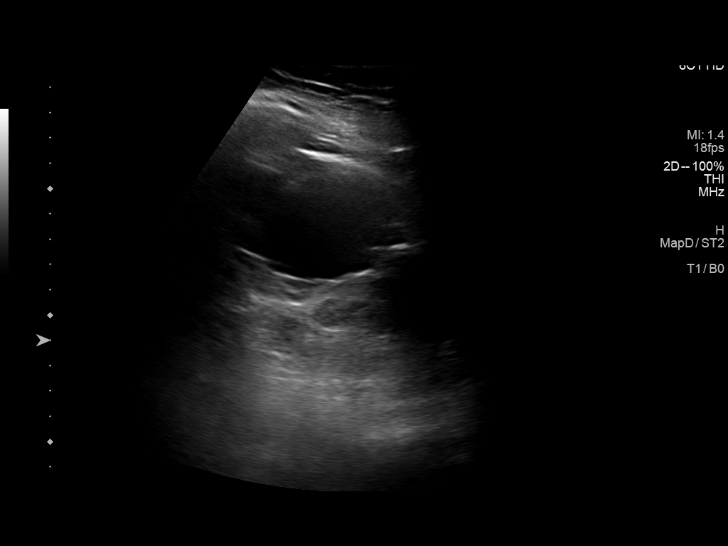
[im 48/53]
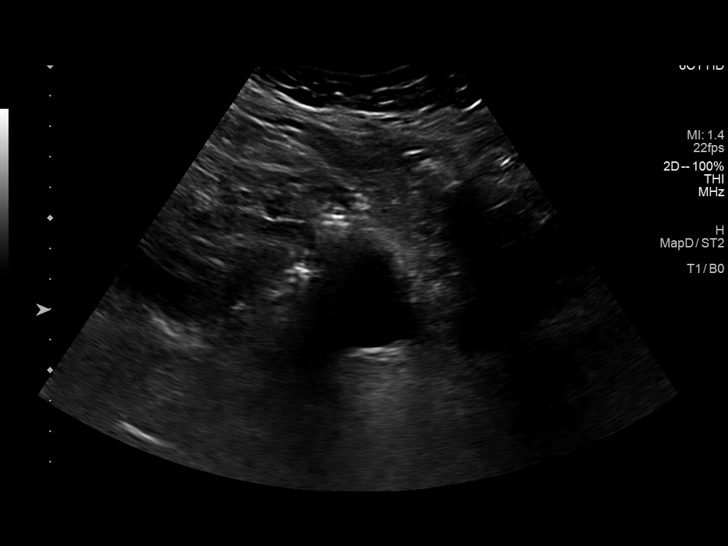
[im 53/53]
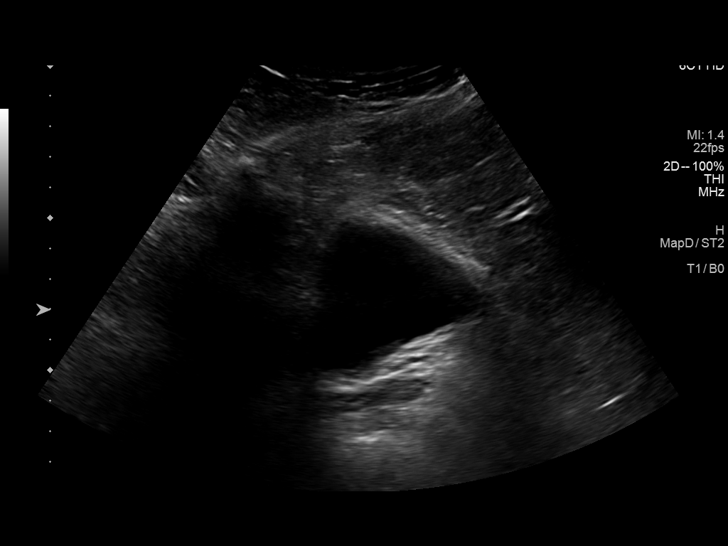

[14 of 25 positions shown; findings below may reference images not displayed]

FINDINGS: Right Kidney:

Renal measurements: 20.7 x 10 x 8.9 cm = volume: 974 mL.
Echogenicity within normal limits. Innumerable partially visualized
cysts with largest measuring up to 7.8 cm. No definite nodularity,
intralesional vascularity or thick septation. No solid mass or
hydronephrosis visualized.

Left Kidney:

Renal measurements: 22 x 8.6 x 7.5 = volume: 743 mL. Echogenicity
within normal limits. Innumerable partially visualized cysts with
largest measuring up to 10.3 cm. No definite nodularity,
intralesional vascularity or thick septation. No solid mass or
hydronephrosis visualized.

Urinary bladder:

Appears normal for degree of bladder distention.

Other:

None.
IMPRESSION: Autosomal dominant polycystic kidney disease. Markedly limited
evaluation of the partially visualized cysts.
